# Patient Record
Sex: Female | Born: 1950 | Hispanic: Yes | Marital: Married | Smoking: Never smoker
Health system: Southern US, Community
[De-identification: ages and names within clinical notes are randomized; demographics above are authoritative.]

## PROBLEM LIST (undated history)

## (undated) DIAGNOSIS — C50919 Malignant neoplasm of unspecified site of unspecified female breast: Secondary | ICD-10-CM

## (undated) DIAGNOSIS — I10 Essential (primary) hypertension: Secondary | ICD-10-CM

## (undated) DIAGNOSIS — K219 Gastro-esophageal reflux disease without esophagitis: Secondary | ICD-10-CM

## (undated) HISTORY — PX: HYSTERECTOMY ABDOMINAL WITH SALPINGO-OOPHORECTOMY: SHX6792

## (undated) HISTORY — DX: Gastro-esophageal reflux disease without esophagitis: K21.9

## (undated) HISTORY — PX: MASTECTOMY: SHX3

## (undated) HISTORY — DX: Essential (primary) hypertension: I10

## (undated) HISTORY — DX: Malignant neoplasm of unspecified site of unspecified female breast: C50.919

---

## 2018-07-28 ENCOUNTER — Inpatient Hospital Stay (HOSPITAL_COMMUNITY)
Admission: AD | Admit: 2018-07-28 | Discharge: 2018-07-29 | DRG: 177 | Disposition: A | Discharge status: Home / Self Care | Payer: HRSA Program | Source: Other Acute Inpatient Hospital | Attending: Internal Medicine | Admitting: Internal Medicine

## 2018-07-28 ENCOUNTER — Inpatient Hospital Stay (HOSPITAL_COMMUNITY): Payer: HRSA Program

## 2018-07-28 DIAGNOSIS — J9601 Acute respiratory failure with hypoxia: Secondary | ICD-10-CM | POA: Diagnosis present

## 2018-07-28 DIAGNOSIS — D696 Thrombocytopenia, unspecified: Secondary | ICD-10-CM | POA: Diagnosis present

## 2018-07-28 DIAGNOSIS — Z8542 Personal history of malignant neoplasm of other parts of uterus: Secondary | ICD-10-CM | POA: Diagnosis not present

## 2018-07-28 DIAGNOSIS — Z79899 Other long term (current) drug therapy: Secondary | ICD-10-CM | POA: Diagnosis not present

## 2018-07-28 DIAGNOSIS — J1289 Other viral pneumonia: Secondary | ICD-10-CM | POA: Diagnosis present

## 2018-07-28 DIAGNOSIS — U071 COVID-19: Secondary | ICD-10-CM | POA: Diagnosis present

## 2018-07-28 DIAGNOSIS — I10 Essential (primary) hypertension: Secondary | ICD-10-CM | POA: Diagnosis present

## 2018-07-28 DIAGNOSIS — R0602 Shortness of breath: Secondary | ICD-10-CM

## 2018-07-28 DIAGNOSIS — R0902 Hypoxemia: Secondary | ICD-10-CM | POA: Diagnosis present

## 2018-07-28 LAB — COMPREHENSIVE METABOLIC PANEL
ALT: 28 U/L (ref 0–44)
AST: 30 U/L (ref 15–41)
Albumin: 3.1 g/dL — ABNORMAL LOW (ref 3.5–5.0)
Alkaline Phosphatase: 105 U/L (ref 38–126)
Anion gap: 9 (ref 5–15)
BUN: 13 mg/dL (ref 8–23)
CO2: 23 mmol/L (ref 22–32)
Calcium: 8.5 mg/dL — ABNORMAL LOW (ref 8.9–10.3)
Chloride: 106 mmol/L (ref 98–111)
Creatinine, Ser: 0.7 mg/dL (ref 0.44–1.00)
GFR calc Af Amer: >60 mL/min (ref >60)
GFR calc non Af Amer: >60 mL/min (ref >60)
Glucose, Bld: 95 mg/dL (ref 70–99)
Potassium: 4.1 mmol/L (ref 3.5–5.1)
Sodium: 138 mmol/L (ref 135–145)
Total Bilirubin: 1.1 mg/dL (ref 0.3–1.2)
Total Protein: 6.9 g/dL (ref 6.5–8.1)

## 2018-07-28 LAB — CBC WITH DIFFERENTIAL/PLATELET
Abs Immature Granulocytes: 0.05 10*3/uL (ref 0.00–0.07)
Basophils Absolute: 0 10*3/uL (ref 0.0–0.1)
Basophils Relative: 0 %
Eosinophils Absolute: 0 10*3/uL (ref 0.0–0.5)
Eosinophils Relative: 0 %
HCT: 43.8 % (ref 36.0–46.0)
Hemoglobin: 14.5 g/dL (ref 12.0–15.0)
Immature Granulocytes: 1 %
Lymphocytes Relative: 8 %
Lymphs Abs: 0.6 10*3/uL — ABNORMAL LOW (ref 0.7–4.0)
MCH: 30.2 pg (ref 26.0–34.0)
MCHC: 33.1 g/dL (ref 30.0–36.0)
MCV: 91.3 fL (ref 80.0–100.0)
Monocytes Absolute: 0.2 10*3/uL (ref 0.1–1.0)
Monocytes Relative: 3 %
Neutro Abs: 6 10*3/uL (ref 1.7–7.7)
Neutrophils Relative %: 88 %
Platelets: 101 10*3/uL — ABNORMAL LOW (ref 150–400)
RBC: 4.8 MIL/uL (ref 3.87–5.11)
RDW: 14 % (ref 11.5–15.5)
WBC: 6.8 10*3/uL (ref 4.0–10.5)
nRBC: 0 % (ref 0.0–0.2)

## 2018-07-28 LAB — C-REACTIVE PROTEIN: CRP: 25.3 mg/dL — ABNORMAL HIGH (ref <1.0)

## 2018-07-28 LAB — FERRITIN: Ferritin: 202 ng/mL (ref 11–307)

## 2018-07-28 LAB — STREP PNEUMONIAE URINARY ANTIGEN: Strep Pneumo Urinary Antigen: NEGATIVE

## 2018-07-28 LAB — D-DIMER, QUANTITATIVE: D-Dimer, Quant: 6.87 ug/mL-FEU — ABNORMAL HIGH (ref 0.00–0.50)

## 2018-07-28 LAB — ABO/RH: ABO/RH(D): A POS

## 2018-07-28 MED ORDER — ONDANSETRON HCL 4 MG/2ML IJ SOLN: 4.0000 mg | Freq: Four times a day (QID) | INTRAMUSCULAR | Status: DC | PRN

## 2018-07-28 MED ORDER — TOCILIZUMAB 400 MG/20ML IV SOLN
8.0000 mg/kg | Freq: Once | INTRAVENOUS | Status: AC
  Administered 2018-07-28: 508 mg via INTRAVENOUS
  Filled 2018-07-28: qty 10

## 2018-07-28 MED ORDER — ACETAMINOPHEN 325 MG PO TABS
650.0000 mg | ORAL_TABLET | Freq: Four times a day (QID) | ORAL | Status: DC | PRN
  Administered 2018-07-28: 650 mg via ORAL
  Filled 2018-07-28: qty 2

## 2018-07-28 MED ORDER — GUAIFENESIN-DM 100-10 MG/5ML PO SYRP
10.0000 mL | ORAL_SOLUTION | ORAL | Status: DC | PRN
  Administered 2018-07-29: 10 mL via ORAL
  Filled 2018-07-28: qty 10

## 2018-07-28 MED ORDER — METHYLPREDNISOLONE SODIUM SUCC 125 MG IJ SOLR
40.0000 mg | Freq: Two times a day (BID) | INTRAMUSCULAR | Status: DC
  Administered 2018-07-28 – 2018-07-29 (×2): 40 mg via INTRAVENOUS
  Filled 2018-07-28 (×2): qty 2

## 2018-07-28 MED ORDER — ONDANSETRON HCL 4 MG PO TABS: 4.0000 mg | ORAL_TABLET | Freq: Four times a day (QID) | ORAL | Status: DC | PRN

## 2018-07-28 MED ORDER — SODIUM CHLORIDE 0.9 % IV SOLN
500.0000 mg | INTRAVENOUS | Status: DC
  Administered 2018-07-29: 500 mg via INTRAVENOUS
  Filled 2018-07-28 (×2): qty 500

## 2018-07-28 MED ORDER — METOPROLOL TARTRATE 25 MG PO TABS
25.0000 mg | ORAL_TABLET | Freq: Every day | ORAL | Status: DC
  Administered 2018-07-28 – 2018-07-29 (×2): 25 mg via ORAL
  Filled 2018-07-28 (×2): qty 1

## 2018-07-28 MED ORDER — ENOXAPARIN SODIUM 40 MG/0.4ML ~~LOC~~ SOLN
40.0000 mg | SUBCUTANEOUS | Status: DC
  Administered 2018-07-28: 40 mg via SUBCUTANEOUS
  Filled 2018-07-28: qty 0.4

## 2018-07-28 MED ORDER — SODIUM CHLORIDE 0.9 % IV SOLN
1.0000 g | INTRAVENOUS | Status: DC
  Administered 2018-07-28: 23:00:00 1 g via INTRAVENOUS
  Filled 2018-07-28 (×3): qty 10

## 2018-07-28 MED ORDER — HYDROCOD POLST-CPM POLST ER 10-8 MG/5ML PO SUER
5.0000 mL | Freq: Two times a day (BID) | ORAL | Status: DC | PRN
  Administered 2018-07-28 – 2018-07-29 (×2): 5 mL via ORAL
  Filled 2018-07-28 (×2): qty 5

## 2018-07-28 NOTE — H&P (Addendum)
History and Physical    Beth Mueller QPY:[id number] DOB: 04-05-1950 DOA: 07/28/2018  PCP: Patient, No Pcp Per  Patient coming from: Home  I have personally briefly reviewed patient's old medical records in [place]  Chief Complaint: Fevers, cough, epigastric pain  HPI: Beth Mueller is a 68 y.o. female with medical history significant of hypertension, presents to the hospital with complaints fever, cough, epigastric pain.  Reports that her cough started approximately 3 days to 2 days.  She also complains of sore throat.  She has had no nausea or vomiting.  No diarrhea.  She complains of epigastric pain which is worse with cough and deep inspiration.  She was evaluated at [facility] where she was noted to be hypoxic with oxygen saturation of 79% on room air.  This improved to 93% on supplemental oxygen.  Chest x-ray was performed that showed bilateral infiltrates.  She is 89 febrile with temperature 101.  She is mildly tachycardic with a heart rate of 112.  She tested positive for COVID-19.  She has been referred for admission.  Patient does not speak english and history is obtained with the assistance of a video interpreter  Review of Systems:  General: Positive for fever, malaise, generalized weakness Respiratory: Positive for cough, pleuritic pain GI negative for nausea, problem, diarrhea All other systems reviewed and found to be negative.   Past medical history: Positive for hypertension, remote history of cancer and uterine cancer  Social History: No history of tobacco, alcohol or drug use  Not on File  Family history: Family history reviewed and not pertinent  Prior to Admission medications   Medication Sig Start Date End Date Taking? Authorizing Provider  acetaminophen (TYLENOL) 500 MG tablet Take 500 mg by mouth every 6 (six) hours as needed for mild pain, fever or headache.   Yes [provider]  metoprolol tartrate (LOPRESSOR) 25 MG  tablet Take 25 mg by mouth daily.   Yes [provider]    Physical Exam: Vitals:   07/28/18 1441 07/28/18 1500 07/28/18 1648  BP: 139/78 133/77   Resp: (!) 30 (!) 25 (!) 24  Temp: 98.9 F (37.2 C)    TempSrc: Axillary    SpO2: 95%    Weight:   63.4 kg  Height:   5' (1.524 m)    Constitutional: NAD, calm, comfortable Eyes: PERRL, lids and conjunctivae normal ENMT: Mucous membranes are moist. Pharyngeal erythema is present.Normal dentition.  Neck: normal, supple, no masses, no thyromegaly Respiratory: clear to auscultation bilaterally, no wheezing, no crackles. Normal respiratory effort. No accessory muscle use.  Cardiovascular: Regular rate and rhythm, no murmurs / rubs / gallops. No extremity edema. 2+ pedal pulses. No carotid bruits.  Abdomen: no tenderness, no masses palpated. No hepatosplenomegaly. Bowel sounds positive.  Musculoskeletal: no clubbing / cyanosis. No joint deformity upper and lower extremities. Good ROM, no contractures. Normal muscle tone.  Skin: no rashes, lesions, ulcers. No induration Neurologic: CN 2-12 grossly intact. Sensation intact, DTR normal. Strength 5/5 in all 4.  Psychiatric: Normal judgment and insight. Alert and oriented x 3. Normal mood.    Labs on Admission: I have personally reviewed following labs and imaging studies  CBC: Recent Labs  Lab 07/28/18 1545  WBC 6.8  NEUTROABS 6.0  HGB 14.5  HCT 43.8  MCV 91.3  PLT 124*   Basic Metabolic Panel: Recent Labs  Lab 07/28/18 1545  NA 138  K 4.1  CL 106  CO2 23  GLUCOSE  95  BUN 13  CREATININE 0.70  CALCIUM 8.5*   GFR: Estimated Creatinine Clearance: 58.3 mL/min (by C-G formula based on SCr of 0.7 mg/dL). Liver Function Tests: Recent Labs  Lab 07/28/18 1545  AST 30  ALT 28  ALKPHOS 105  BILITOT 1.1  PROT 6.9  ALBUMIN 3.1*   No results for input(s): LIPASE, AMYLASE in the last 168 hours. No results for input(s): AMMONIA in the last 168 hours. Coagulation  Profile: No results for input(s): INR, PROTIME in the last 168 hours. Cardiac Enzymes: No results for input(s): CKTOTAL, CKMB, CKMBINDEX, TROPONINI in the last 168 hours. BNP (last 3 results) No results for input(s): PROBNP in the last 8760 hours. HbA1C: No results for input(s): HGBA1C in the last 72 hours. CBG: No results for input(s): GLUCAP in the last 168 hours. Lipid Profile: No results for input(s): CHOL, HDL, LDLCALC, TRIG, CHOLHDL, LDLDIRECT in the last 72 hours. Thyroid Function Tests: No results for input(s): TSH, T4TOTAL, FREET4, T3FREE, THYROIDAB in the last 72 hours. Anemia Panel: Recent Labs    07/28/18 1545  FERRITIN 202   Urine analysis: No results found for: COLORURINE, APPEARANCEUR, LABSPEC, PHURINE, GLUCOSEU, HGBUR, BILIRUBINUR, KETONESUR, PROTEINUR, UROBILINOGEN, NITRITE, LEUKOCYTESUR  Radiological Exams on Admission: Portable Chest 1 View  Result Date: 07/28/2018 CLINICAL DATA:  Shortness of breath. EXAM: PORTABLE CHEST 1 VIEW COMPARISON:  None. FINDINGS: Stable cardiomediastinal silhouette. No pneumothorax is noted. Mild bibasilar subsegmental atelectasis or infiltrates are noted. No pleural effusion is noted. Bony thorax is unremarkable. IMPRESSION: Mild bibasilar subsegmental atelectasis or infiltrates are noted. Electronically Signed   By: Marijo Conception M.D.   On: 07/28/2018 16:00    Assessment/Plan Active Problems:   Acute respiratory failure with hypoxia (HCC)   HTN (hypertension)   COVID-19 virus infection   Thrombocytopenia ([place])     1. Acute respiratory failure with hypoxia, secondary to COVID-19/pneumonia.  She is requiring supplemental oxygen.  We will try to wean down to room air as tolerated.  Inflammatory markers negative.she will be started on a short course of steroids for 3 days as well as received a dose of Actemra.  Follow serial labs.  Insert urinary antigens.  Start azithromycin and ceftriaxone 2. Hypertension.  Blood pressure  elevated.  Continue metoprolol. 3. Thrombocytopenia.  Suspect this is related to acute infectious process.  Continue to monitor  DVT prophylaxis: lovenox Code Status: full code  Family Communication: no family present  Disposition Plan: discharge home once respiratory status has improved  Consults called:   Admission status: inpatient, progressive care   Kathie Dike MD [facility]   If 7PM-7AM, please contact night-coverage [email or web address]   07/28/2018, 6:55 PM

## 2018-07-29 ENCOUNTER — Encounter (HOSPITAL_COMMUNITY): Payer: Self-pay

## 2018-07-29 ENCOUNTER — Other Ambulatory Visit: Payer: Self-pay

## 2018-07-29 LAB — CBC WITH DIFFERENTIAL/PLATELET
Abs Immature Granulocytes: 0.03 10*3/uL (ref 0.00–0.07)
Basophils Absolute: 0 10*3/uL (ref 0.0–0.1)
Basophils Relative: 0 %
Eosinophils Absolute: 0 10*3/uL (ref 0.0–0.5)
Eosinophils Relative: 0 %
HCT: 39.6 % (ref 36.0–46.0)
Hemoglobin: 13 g/dL (ref 12.0–15.0)
Immature Granulocytes: 1 %
Lymphocytes Relative: 7 %
Lymphs Abs: 0.4 10*3/uL — ABNORMAL LOW (ref 0.7–4.0)
MCH: 30 pg (ref 26.0–34.0)
MCHC: 32.8 g/dL (ref 30.0–36.0)
MCV: 91.2 fL (ref 80.0–100.0)
Monocytes Absolute: 0.1 10*3/uL (ref 0.1–1.0)
Monocytes Relative: 1 %
Neutro Abs: 4.5 10*3/uL (ref 1.7–7.7)
Neutrophils Relative %: 91 %
Platelets: 108 10*3/uL — ABNORMAL LOW (ref 150–400)
RBC: 4.34 MIL/uL (ref 3.87–5.11)
RDW: 14 % (ref 11.5–15.5)
WBC: 5 10*3/uL (ref 4.0–10.5)
nRBC: 0 % (ref 0.0–0.2)

## 2018-07-29 LAB — BLOOD CULTURE ID PANEL (REFLEXED)

## 2018-07-29 LAB — COMPREHENSIVE METABOLIC PANEL
ALT: 23 U/L (ref 0–44)
AST: 28 U/L (ref 15–41)
Albumin: 2.8 g/dL — ABNORMAL LOW (ref 3.5–5.0)
Alkaline Phosphatase: 91 U/L (ref 38–126)
Anion gap: 8 (ref 5–15)
BUN: 15 mg/dL (ref 8–23)
CO2: 21 mmol/L — ABNORMAL LOW (ref 22–32)
Calcium: 8.1 mg/dL — ABNORMAL LOW (ref 8.9–10.3)
Chloride: 106 mmol/L (ref 98–111)
Creatinine, Ser: 0.6 mg/dL (ref 0.44–1.00)
GFR calc Af Amer: >60 mL/min (ref >60)
GFR calc non Af Amer: >60 mL/min (ref >60)
Glucose, Bld: 155 mg/dL — ABNORMAL HIGH (ref 70–99)
Potassium: 4.2 mmol/L (ref 3.5–5.1)
Sodium: 135 mmol/L (ref 135–145)
Total Bilirubin: 0.7 mg/dL (ref 0.3–1.2)
Total Protein: 6.4 g/dL — ABNORMAL LOW (ref 6.5–8.1)

## 2018-07-29 LAB — FERRITIN: Ferritin: 205 ng/mL (ref 11–307)

## 2018-07-29 LAB — C-REACTIVE PROTEIN: CRP: 22.8 mg/dL — ABNORMAL HIGH (ref <1.0)

## 2018-07-29 LAB — HIV ANTIBODY (ROUTINE TESTING W REFLEX): HIV Screen 4th Generation wRfx: NONREACTIVE

## 2018-07-29 LAB — D-DIMER, QUANTITATIVE: D-Dimer, Quant: 6.08 ug/mL-FEU — ABNORMAL HIGH (ref 0.00–0.50)

## 2018-07-29 MED ORDER — HYDROCOD POLST-CPM POLST ER 10-8 MG/5ML PO SUER: 5.0000 mL | Freq: Two times a day (BID) | ORAL | 0 refills | Status: AC | PRN

## 2018-07-29 MED ORDER — GUAIFENESIN ER 600 MG PO TB12: 600.0000 mg | ORAL_TABLET | Freq: Two times a day (BID) | ORAL | 2 refills | Status: AC

## 2018-07-29 MED ORDER — ENSURE ENLIVE PO LIQD
237.0000 mL | Freq: Two times a day (BID) | ORAL | Status: DC
  Administered 2018-07-29: 11:00:00 237 mL via ORAL

## 2018-07-29 MED ORDER — PREDNISONE 10 MG PO TABS: ORAL_TABLET | ORAL | 0 refills | Status: DC

## 2018-07-29 MED ORDER — ASPIRIN EC 325 MG PO TBEC: 325.0000 mg | DELAYED_RELEASE_TABLET | Freq: Two times a day (BID) | ORAL | 0 refills | Status: DC

## 2018-07-29 MED ORDER — AZITHROMYCIN 500 MG PO TABS: 500.0000 mg | ORAL_TABLET | Freq: Every day | ORAL | 0 refills | Status: DC

## 2018-07-29 MED ORDER — PANTOPRAZOLE SODIUM 40 MG PO TBEC: 40.0000 mg | DELAYED_RELEASE_TABLET | Freq: Every day | ORAL | 1 refills | Status: DC

## 2018-07-29 NOTE — Progress Notes (Signed)
Initial Nutrition Assessment   RD working remotely.  DOCUMENTATION CODES:   Not applicable  INTERVENTION:   Liberalize diet to REGULAR with promote po intake; messaged Attending MD with recommendation  Ensure Enlive po BID, each supplement provides 350 kcal and 20 grams of protein  NUTRITION DIAGNOSIS:   Increased nutrient needs related to acute illness as evidenced by estimated needs.  GOAL:   Patient will meet greater than or equal to 90% of their needs  MONITOR:   PO intake, Supplement acceptance, Labs, Weight trends  REASON FOR ASSESSMENT:   Consult Assessment of nutrition requirement/status  ASSESSMENT:   68 yo female admitted with acute respiratory failure with COVID-19 pneumonia. PMH includes HTN, hx of uterine cancer  No recorded po intake; unable to assess diet and weight history.  No previous weight encounters.   No N/V/D  Labs: reviewed Meds: solumedrol  NUTRITION - FOCUSED PHYSICAL EXAM:  Unable to assess, working remotely  Diet Order:   Diet Order            Diet Heart Room service appropriate? Yes; Fluid consistency: Thin  Diet effective now              EDUCATION NEEDS:   Not appropriate for education at this time  Skin:  Skin Assessment: Reviewed RN Assessment  Last BM:  5/14  Height:   Ht Readings from Last 1 Encounters:  07/28/18 5' (1.524 m)    Weight:   Wt Readings from Last 1 Encounters:  07/28/18 63.4 kg    Ideal Body Weight:  45.5 kg  BMI:  Body mass index is 27.28 kg/m.  Estimated Nutritional Needs:   Kcal:  1600-1800 kcals   Protein:  80-90 g   Fluid:  >/= 1.6 L   Kerman Passey MS, RD, LDN, CNSC [phone] Pager  [phone] Weekend/On-Call Pager

## 2018-07-29 NOTE — Progress Notes (Signed)
Discharge instructions reviewed with patient with the translator. Answered all questions and concerns. Patiens IV discontinued. Transported patient to family member down stairs.

## 2018-07-29 NOTE — Plan of Care (Signed)
  Problem: Coping: Goal: Psychosocial and spiritual needs will be supported Outcome: Progressing   Problem: Respiratory: Goal: Will maintain a patent airway Outcome: Progressing   

## 2018-07-29 NOTE — Progress Notes (Signed)
PHARMACY - PHYSICIAN COMMUNICATION CRITICAL VALUE ALERT - BLOOD CULTURE IDENTIFICATION (BCID)  Beth Mueller is an 68 y.o. female who presented to [facility] on 07/28/2018 with a chief complaint of fever, cough and epigastric pain  Assessment:  Diagnosed with COVID-19. Now 1/2 BCx showing gram positive cocci on gram stain in 1/2 blood cultures. This is likely a contaminant. WBC wnl  Name of physician (or Provider) Contacted: Dr. Roderic Palau  Current antibiotics: Ceftriaxone and Azithromycin for pneumonia   Changes to prescribed antibiotics recommended:  Patient is on recommended antibiotics - No changes needed  Results for orders placed or performed during the hospital encounter of 07/28/18  Blood Culture ID Panel (Reflexed) (Collected: 07/28/2018  3:17 PM)  Result Value Ref Range   Enterococcus species NOT DETECTED NOT DETECTED   Listeria monocytogenes NOT DETECTED NOT DETECTED   Staphylococcus species DETECTED (A) NOT DETECTED   Staphylococcus aureus (BCID) NOT DETECTED NOT DETECTED   Methicillin resistance NOT DETECTED NOT DETECTED   Streptococcus species NOT DETECTED NOT DETECTED   Streptococcus agalactiae NOT DETECTED NOT DETECTED   Streptococcus pneumoniae NOT DETECTED NOT DETECTED   Streptococcus pyogenes NOT DETECTED NOT DETECTED   Acinetobacter baumannii NOT DETECTED NOT DETECTED   Enterobacteriaceae species NOT DETECTED NOT DETECTED   Enterobacter cloacae complex NOT DETECTED NOT DETECTED   Escherichia coli NOT DETECTED NOT DETECTED   Klebsiella oxytoca NOT DETECTED NOT DETECTED   Klebsiella pneumoniae NOT DETECTED NOT DETECTED   Proteus species NOT DETECTED NOT DETECTED   Serratia marcescens NOT DETECTED NOT DETECTED   Haemophilus influenzae NOT DETECTED NOT DETECTED   Neisseria meningitidis NOT DETECTED NOT DETECTED   Pseudomonas aeruginosa NOT DETECTED NOT DETECTED   Candida albicans NOT DETECTED NOT DETECTED   Candida glabrata NOT DETECTED NOT DETECTED   Candida  krusei NOT DETECTED NOT DETECTED   Candida parapsilosis NOT DETECTED NOT DETECTED   Candida tropicalis NOT DETECTED NOT DETECTED    Albertina Parr, PharmD., BCPS Clinical Pharmacist Clinical phone for 07/29/18 until 5pm: [phone]

## 2018-07-29 NOTE — Discharge Summary (Signed)
Physician Discharge Summary  Beth Mueller ACZ:[id number] DOB: 1950/07/26 DOA: 07/28/2018  PCP: Patient, No Pcp Per  Admit date: 07/28/2018 Discharge date: 07/29/2018  Admitted From: Home Disposition: Home  Recommendations for Outpatient Follow-up:  1. Follow up with PCP in 1-2 weeks 2. Please obtain BMP/CBC in one week 3. Patient received instructions on how to isolate at home.  She was advised on warning symptoms for her to return to the emergency room.  Discharge Condition: Stable CODE STATUS: Full code Diet recommendation: Heart healthy  Brief/Interim Summary: 68 year old female presented to [facility] with complaints of fevers, cough and epigastric pain with cough.  She was found to have coronavirus.  She was found to be hypoxic on room air requiring 3 L of oxygen.  Her husband is currently admitted to the hospital with a coronavirus infection.  She was transferred to [facility] for further management.  She did receive a dose of Actemra and steroids.  By the following day, she was on room air and ambulating without difficulty.  She did have a minimal cough, but this improved with anti tussives. She has been ambulating without difficulty. She has been transitioned to a steroid taper. She feels comfortable going home and isolating at home.   Discharge Diagnoses:  Active Problems:   Acute respiratory failure with hypoxia (HCC)   HTN (hypertension)   COVID-19 virus infection   Thrombocytopenia (HCC)    Discharge Instructions  Discharge Instructions    Diet - low sodium heart healthy   Complete by:  As directed    Increase activity slowly   Complete by:  As directed      Allergies as of 07/29/2018   Not on File     Medication List    TAKE these medications   acetaminophen 500 MG tablet Commonly known as:  TYLENOL Take 500 mg by mouth every 6 (six) hours as needed for mild pain, fever or headache.   aspirin EC 325 MG tablet Take 1 tablet (325 mg  total) by mouth 2 (two) times a day for 10 days.   azithromycin 500 MG tablet Commonly known as:  Zithromax Take 1 tablet (500 mg total) by mouth daily for 4 days. Take 1 tablet daily for 3 days.   chlorpheniramine-HYDROcodone 10-8 MG/5ML Suer Commonly known as:  TUSSIONEX Take 5 mLs by mouth every 12 (twelve) hours as needed for cough (refractory).   guaiFENesin 600 MG 12 hr tablet Commonly known as:  Mucinex Take 1 tablet (600 mg total) by mouth 2 (two) times daily.   metoprolol tartrate 25 MG tablet Commonly known as:  LOPRESSOR Take 25 mg by mouth daily.   pantoprazole 40 MG tablet Commonly known as:  Protonix Take 1 tablet (40 mg total) by mouth daily.   predniSONE 10 MG tablet Commonly known as:  DELTASONE Take 40mg  po daily for 2 days then 30mg  daily for 2 days then 20mg  daily for 2 days then 10mg  daily for 2 days then stop      Follow-up [facility] Follow up on 08/02/2018.   Why:  at 2:30pm for your televisit hospital follow-up appointment; please be available for the call.  Contact information: [address] [place]  [address] [phone]         Not on File  Consultations:     Procedures/Studies: Portable Chest 1 View  Result Date: 07/28/2018 CLINICAL DATA:  Shortness of breath. EXAM: PORTABLE CHEST 1 VIEW  COMPARISON:  None. FINDINGS: Stable cardiomediastinal silhouette. No pneumothorax is noted. Mild bibasilar subsegmental atelectasis or infiltrates are noted. No pleural effusion is noted. Bony thorax is unremarkable. IMPRESSION: Mild bibasilar subsegmental atelectasis or infiltrates are noted. Electronically Signed   By: Marijo Conception M.D.   On: 07/28/2018 16:00       Subjective: Feeling better today.  Shortness of breath is better.  Able to ambulate.  Still has some cough, but this improves with cough medicine. Discharge Exam: Vitals:   07/29/18 0818 07/29/18 0858 07/29/18  1216 07/29/18 1702  BP:  116/78    Pulse:  72    Resp:      Temp: 98.2 F (36.8 C)  98.2 F (36.8 C) 98.2 F (36.8 C)  TempSrc: Oral  Oral Oral  SpO2:      Weight:      Height:        General: Pt is alert, awake, not in acute distress Cardiovascular: RRR, S1/S2 +, no rubs, no gallops Respiratory: CTA bilaterally, no wheezing, no rhonchi Abdominal: Soft, NT, ND, bowel sounds + Extremities: no edema, no cyanosis    The results of significant diagnostics from this hospitalization (including imaging, microbiology, ancillary and laboratory) are listed below for reference.     Microbiology: Recent Results (from the past 240 hour(s))  Culture, blood (Routine X 2) w Reflex to ID Panel     Status: None (Preliminary result)   Collection Time: 07/28/18  3:14 PM  Result Value Ref Range Status   Specimen Description BLOOD LEFT ANTECUBITAL  Final   Special Requests   Final    BOTTLES DRAWN AEROBIC ONLY Blood Culture adequate volume   Culture   Final    NO GROWTH < 24 HOURS Performed at [facility], [address], [place], [place] [address]    Report Status PENDING  Incomplete  Culture, blood (Routine X 2) w Reflex to ID Panel     Status: None (Preliminary result)   Collection Time: 07/28/18  3:17 PM  Result Value Ref Range Status   Specimen Description BLOOD LEFT HAND  Final   Special Requests   Final    BOTTLES DRAWN AEROBIC ONLY Blood Culture adequate volume   Culture  Setup Time   Final    GRAM POSITIVE COCCI AEROBIC BOTTLE ONLY Organism ID to follow CRITICAL RESULT CALLED TO, READ BACK BY AND VERIFIED WITH: [place] [id number] MLM Performed at [facility], [place] [address], [place], [place] [address]    Culture GRAM POSITIVE COCCI  Final   Report Status PENDING  Incomplete  Blood Culture ID Panel (Reflexed)     Status: Abnormal   Collection Time: 07/28/18  3:17 PM  Result Value Ref Range Status   Enterococcus species NOT DETECTED NOT DETECTED  Final   Listeria monocytogenes NOT DETECTED NOT DETECTED Final   Staphylococcus species DETECTED (A) NOT DETECTED Final    Comment: Methicillin (oxacillin) susceptible coagulase negative staphylococcus. Possible blood culture contaminant (unless isolated from more than one blood culture draw or clinical case suggests pathogenicity). No antibiotic treatment is indicated for blood  culture contaminants. CRITICAL RESULT CALLED TO, READ BACK BY AND VERIFIED WITH: PHARMD B MANCHERIL [id number] MLM    Staphylococcus aureus (BCID) NOT DETECTED NOT DETECTED Final   Methicillin resistance NOT DETECTED NOT DETECTED Final   Streptococcus species NOT DETECTED NOT DETECTED Final   Streptococcus agalactiae NOT DETECTED NOT DETECTED Final   Streptococcus pneumoniae NOT  DETECTED NOT DETECTED Final   Streptococcus pyogenes NOT DETECTED NOT DETECTED Final   Acinetobacter baumannii NOT DETECTED NOT DETECTED Final   Enterobacteriaceae species NOT DETECTED NOT DETECTED Final   Enterobacter cloacae complex NOT DETECTED NOT DETECTED Final   Escherichia coli NOT DETECTED NOT DETECTED Final   Klebsiella oxytoca NOT DETECTED NOT DETECTED Final   Klebsiella pneumoniae NOT DETECTED NOT DETECTED Final   Proteus species NOT DETECTED NOT DETECTED Final   Serratia marcescens NOT DETECTED NOT DETECTED Final   Haemophilus influenzae NOT DETECTED NOT DETECTED Final   Neisseria meningitidis NOT DETECTED NOT DETECTED Final   Pseudomonas aeruginosa NOT DETECTED NOT DETECTED Final   Candida albicans NOT DETECTED NOT DETECTED Final   Candida glabrata NOT DETECTED NOT DETECTED Final   Candida krusei NOT DETECTED NOT DETECTED Final   Candida parapsilosis NOT DETECTED NOT DETECTED Final   Candida tropicalis NOT DETECTED NOT DETECTED Final    Comment: Performed at [facility], [place] [address] [place], [place] [address]     Labs: BNP (last 3 results) No results for input(s): BNP in the last 8760 hours. Basic  Metabolic Panel: Recent Labs  Lab 07/28/18 1545 07/29/18 0332  NA 138 135  K 4.1 4.2  CL 106 106  CO2 23 21*  GLUCOSE 95 155*  BUN 13 15  CREATININE 0.70 0.60  CALCIUM 8.5* 8.1*   Liver Function Tests: Recent Labs  Lab 07/28/18 1545 07/29/18 0332  AST 30 28  ALT 28 23  ALKPHOS 105 91  BILITOT 1.1 0.7  PROT 6.9 6.4*  ALBUMIN 3.1* 2.8*   No results for input(s): LIPASE, AMYLASE in the last 168 hours. No results for input(s): AMMONIA in the last 168 hours. CBC: Recent Labs  Lab 07/28/18 1545 07/29/18 0332  WBC 6.8 5.0  NEUTROABS 6.0 4.5  HGB 14.5 13.0  HCT 43.8 39.6  MCV 91.3 91.2  PLT 101* 108*   Cardiac Enzymes: No results for input(s): CKTOTAL, CKMB, CKMBINDEX, TROPONINI in the last 168 hours. BNP: Invalid input(s): POCBNP CBG: No results for input(s): GLUCAP in the last 168 hours. D-Dimer Recent Labs    07/28/18 1545 07/29/18 0332  DDIMER 6.87* 6.08*   Hgb A1c No results for input(s): HGBA1C in the last 72 hours. Lipid Profile No results for input(s): CHOL, HDL, LDLCALC, TRIG, CHOLHDL, LDLDIRECT in the last 72 hours. Thyroid function studies No results for input(s): TSH, T4TOTAL, T3FREE, THYROIDAB in the last 72 hours.  Invalid input(s): FREET3 Anemia work up Recent Labs    07/28/18 1545 07/29/18 0332  FERRITIN 202 205   Urinalysis No results found for: COLORURINE, APPEARANCEUR, [place], [place], GLUCOSEU, [place], [place], [place], PROTEINUR, UROBILINOGEN, NITRITE, LEUKOCYTESUR Sepsis Labs Invalid input(s): PROCALCITONIN,  WBC,  LACTICIDVEN Microbiology Recent Results (from the past 240 hour(s))  Culture, blood (Routine X 2) w Reflex to ID Panel     Status: None (Preliminary result)   Collection Time: 07/28/18  3:14 PM  Result Value Ref Range Status   Specimen Description BLOOD LEFT ANTECUBITAL  Final   Special Requests   Final    BOTTLES DRAWN AEROBIC ONLY Blood Culture adequate volume   Culture   Final    NO GROWTH < 24  HOURS Performed at [facility], [address] [place], [place] [address]    Report Status PENDING  Incomplete  Culture, blood (Routine X 2) w Reflex to ID Panel     Status: None (Preliminary result)   Collection Time: 07/28/18  3:17 PM  Result Value Ref Range Status   Specimen Description BLOOD LEFT HAND  Final   Special Requests   Final    BOTTLES DRAWN AEROBIC ONLY Blood Culture adequate volume   Culture  Setup Time   Final    GRAM POSITIVE COCCI AEROBIC BOTTLE ONLY Organism ID to follow CRITICAL RESULT CALLED TO, READ BACK BY AND VERIFIED WITH: [place] [id number] MLM Performed at [facility], [place] [address] [place], [place] [address]    Culture GRAM POSITIVE COCCI  Final   Report Status PENDING  Incomplete  Blood Culture ID Panel (Reflexed)     Status: Abnormal   Collection Time: 07/28/18  3:17 PM  Result Value Ref Range Status   Enterococcus species NOT DETECTED NOT DETECTED Final   Listeria monocytogenes NOT DETECTED NOT DETECTED Final   Staphylococcus species DETECTED (A) NOT DETECTED Final    Comment: Methicillin (oxacillin) susceptible coagulase negative staphylococcus. Possible blood culture contaminant (unless isolated from more than one blood culture draw or clinical case suggests pathogenicity). No antibiotic treatment is indicated for blood  culture contaminants. CRITICAL RESULT CALLED TO, READ BACK BY AND VERIFIED WITH: PHARMD B MANCHERIL [id number] MLM    Staphylococcus aureus (BCID) NOT DETECTED NOT DETECTED Final   Methicillin resistance NOT DETECTED NOT DETECTED Final   Streptococcus species NOT DETECTED NOT DETECTED Final   Streptococcus agalactiae NOT DETECTED NOT DETECTED Final   Streptococcus pneumoniae NOT DETECTED NOT DETECTED Final   Streptococcus pyogenes NOT DETECTED NOT DETECTED Final   Acinetobacter baumannii NOT DETECTED NOT DETECTED Final   Enterobacteriaceae species NOT DETECTED NOT DETECTED Final   Enterobacter  cloacae complex NOT DETECTED NOT DETECTED Final   Escherichia coli NOT DETECTED NOT DETECTED Final   Klebsiella oxytoca NOT DETECTED NOT DETECTED Final   Klebsiella pneumoniae NOT DETECTED NOT DETECTED Final   Proteus species NOT DETECTED NOT DETECTED Final   Serratia marcescens NOT DETECTED NOT DETECTED Final   Haemophilus influenzae NOT DETECTED NOT DETECTED Final   Neisseria meningitidis NOT DETECTED NOT DETECTED Final   Pseudomonas aeruginosa NOT DETECTED NOT DETECTED Final   Candida albicans NOT DETECTED NOT DETECTED Final   Candida glabrata NOT DETECTED NOT DETECTED Final   Candida krusei NOT DETECTED NOT DETECTED Final   Candida parapsilosis NOT DETECTED NOT DETECTED Final   Candida tropicalis NOT DETECTED NOT DETECTED Final    Comment: Performed at [facility], [place]. [address], [place], [place] [address]     Time coordinating discharge: 43mins  SIGNED:   Kathie Dike, MD  [facility] 07/29/2018, 7:44 PM   If 7PM-7AM, please contact night-coverage [email or web address]

## 2018-07-29 NOTE — TOC Transition Note (Signed)
Transition of Care [facility]) - CM/SW Discharge Note   Patient Details  Name: Beth Mueller MRN: [id number] Date of Birth: April 14, 1950  Transition of Care [facility] CM/SW Contact:  Midge Minium RN, BSN, NCM-BC, ACM-RN [phone] Phone Number: 07/29/2018, 4:00 PM   Clinical Narrative:    CM consult acknowledged for CM assist for DME. CM spoke to the patient utilizing [employer] [id number]). Patient stated she lives at home with her daughter [name]; has (4) children. Patient confirmed as having no active health insurance, nor an established PCP. CM assessed medication assistance needs, with the patient stating she has (2) daughters who can assist her with her Rx sent to [facility] after discharge. CM arrange a televisit hospital follow-up appointment with: [facility] on 08/02/18 @ 1430; AVS updated. Patient indicated that her family would be available to provide transportation home.    Final next level of care: Home/Self Care Barriers to Discharge: No Barriers Identified   Patient Goals and CMS Choice Patient states their goals for this hospitalization and ongoing recovery are:: "to return home with my daughter [name]"   Choice offered to / list presented to : NA  Discharge Plan and Services           DME Arranged: N/A DME Agency: NA       HH Arranged: NA HH Agency: NA        Social Determinants of Health (SDOH) Interventions     Readmission Risk Interventions No flowsheet data found.

## 2018-07-29 NOTE — Progress Notes (Signed)
Called patients family and gave them an update

## 2018-07-29 NOTE — Progress Notes (Signed)
CardioVascular Research Department and AHF Team  ReDS Research Project   Patient #: [id number]  ReDS Measurement  Right: 27 %  Left: 31 %

## 2018-07-29 NOTE — Discharge Instructions (Signed)
Person Under Monitoring Name: Beth Mueller  Location: [place] [address]   Infection Prevention Recommendations for Individuals Confirmed to have, or Being Evaluated for, 2019 Novel Coronavirus (COVID-19) Infection Who Receive Care at Home  Individuals who are confirmed to have, or are being evaluated for, COVID-19 should follow the prevention steps below until a healthcare provider or local or state health department says they can return to normal activities.  Stay home except to get medical care You should restrict activities outside your home, except for getting medical care. Do not go to work, school, or public areas, and do not use public transportation or taxis.  Call ahead before visiting your doctor Before your medical appointment, call the healthcare provider and tell them that you have, or are being evaluated for, COVID-19 infection. This will help the healthcare providers office take steps to keep other people from getting infected. Ask your healthcare provider to call the local or state health department.  Monitor your symptoms Seek prompt medical attention if your illness is worsening (e.g., difficulty breathing). Before going to your medical appointment, call the healthcare provider and tell them that you have, or are being evaluated for, COVID-19 infection. Ask your healthcare provider to call the local or state health department.  Wear a facemask You should wear a facemask that covers your nose and mouth when you are in the same room with other people and when you visit a healthcare provider. People who live with or visit you should also wear a facemask while they are in the same room with you.  Separate yourself from other people in your home As much as possible, you should stay in a different room from other people in your home. Also, you should use a separate bathroom, if available.  Avoid sharing household items You should not  share dishes, drinking glasses, cups, eating utensils, towels, bedding, or other items with other people in your home. After using these items, you should wash them thoroughly with soap and water.  Cover your coughs and sneezes Cover your mouth and nose with a tissue when you cough or sneeze, or you can cough or sneeze into your sleeve. Throw used tissues in a lined trash can, and immediately wash your hands with soap and water for at least 20 seconds or use an alcohol-based hand rub.  Wash your [facility] your hands often and thoroughly with soap and water for at least 20 seconds. You can use an alcohol-based hand sanitizer if soap and water are not available and if your hands are not visibly dirty. Avoid touching your eyes, nose, and mouth with unwashed hands.   Prevention Steps for Caregivers and Household Members of Individuals Confirmed to have, or Being Evaluated for, COVID-19 Infection Being Cared for in the Home  If you live with, or provide care at home for, a person confirmed to have, or being evaluated for, COVID-19 infection please follow these guidelines to prevent infection:  Follow healthcare providers instructions Make sure that you understand and can help the patient follow any healthcare provider instructions for all care.  Provide for the patients basic needs You should help the patient with basic needs in the home and provide support for getting groceries, prescriptions, and other personal needs.  Monitor the patients symptoms If they are getting sicker, call his or her medical provider and tell them that the patient has, or is being evaluated for, COVID-19 infection. This will help the healthcare providers  office take steps to keep other people from getting infected. Ask the healthcare provider to call the local or state health department.  Limit the number of people who have contact with the patient  If possible, have only one caregiver for the  patient.  Other household members should stay in another home or place of residence. If this is not possible, they should stay  in another room, or be separated from the patient as much as possible. Use a separate bathroom, if available.  Restrict visitors who do not have an essential need to be in the home.  Keep older adults, very young children, and other sick people away from the patient Keep older adults, very young children, and those who have compromised immune systems or chronic health conditions away from the patient. This includes people with chronic heart, lung, or kidney conditions, diabetes, and cancer.  Ensure good ventilation Make sure that shared spaces in the home have good air flow, such as from an air conditioner or an opened window, weather permitting.  Wash your hands often  Wash your hands often and thoroughly with soap and water for at least 20 seconds. You can use an alcohol based hand sanitizer if soap and water are not available and if your hands are not visibly dirty.  Avoid touching your eyes, nose, and mouth with unwashed hands.  Use disposable paper towels to dry your hands. If not available, use dedicated cloth towels and replace them when they become wet.  Wear a facemask and gloves  Wear a disposable facemask at all times in the room and gloves when you touch or have contact with the patients blood, body fluids, and/or secretions or excretions, such as sweat, saliva, sputum, nasal mucus, vomit, urine, or feces.  Ensure the mask fits over your nose and mouth tightly, and do not touch it during use.  Throw out disposable facemasks and gloves after using them. Do not reuse.  Wash your hands immediately after removing your facemask and gloves.  If your personal clothing becomes contaminated, carefully remove clothing and launder. Wash your hands after handling contaminated clothing.  Place all used disposable facemasks, gloves, and other waste in a lined  container before disposing them with other household waste.  Remove gloves and wash your hands immediately after handling these items.  Do not share dishes, glasses, or other household items with the patient  Avoid sharing household items. You should not share dishes, drinking glasses, cups, eating utensils, towels, bedding, or other items with a patient who is confirmed to have, or being evaluated for, COVID-19 infection.  After the person uses these items, you should wash them thoroughly with soap and water.  Wash laundry thoroughly  Immediately remove and wash clothes or bedding that have blood, body fluids, and/or secretions or excretions, such as sweat, saliva, sputum, nasal mucus, vomit, urine, or feces, on them.  Wear gloves when handling laundry from the patient.  Read and follow directions on labels of laundry or clothing items and detergent. In general, wash and dry with the warmest temperatures recommended on the label.  Clean all areas the individual has used often  Clean all touchable surfaces, such as counters, tabletops, doorknobs, bathroom fixtures, toilets, phones, keyboards, tablets, and bedside tables, every day. Also, clean any surfaces that may have blood, body fluids, and/or secretions or excretions on them.  Wear gloves when cleaning surfaces the patient has come in contact with.  Use a diluted bleach solution (e.g., dilute bleach with 1  part bleach and 10 parts water) or a household disinfectant with a label that says EPA-registered for coronaviruses. To make a bleach solution at home, add 1 tablespoon of bleach to 1 quart (4 cups) of water. For a larger supply, add  cup of bleach to 1 gallon (16 cups) of water.  Read labels of cleaning products and follow recommendations provided on product labels. Labels contain instructions for safe and effective use of the cleaning product including precautions you should take when applying the product, such as wearing gloves or  eye protection and making sure you have good ventilation during use of the product.  Remove gloves and wash hands immediately after cleaning.  Monitor yourself for signs and symptoms of illness Caregivers and household members are considered close contacts, should monitor their health, and will be asked to limit movement outside of the home to the extent possible. Follow the monitoring steps for close contacts listed on the symptom monitoring form.   ? If you have additional questions, contact your local health department or call the epidemiologist on call at [phone] (available 24/7). ? This guidance is subject to change. For the most up-to-date guidance from [facility], please refer to their website: [email or web address]

## 2018-07-31 LAB — CULTURE, BLOOD (ROUTINE X 2)
Special Requests: ADEQUATE
Special Requests: ADEQUATE

## 2018-08-01 LAB — LEGIONELLA PNEUMOPHILA SEROGP 1 UR AG: L. pneumophila Serogp 1 Ur Ag: NEGATIVE

## 2018-08-02 ENCOUNTER — Inpatient Hospital Stay (HOSPITAL_COMMUNITY)
Admission: AD | Admit: 2018-08-02 | Discharge: 2018-08-06 | DRG: 177 | Disposition: A | Discharge status: Home / Self Care | Payer: HRSA Program | Source: Other Acute Inpatient Hospital | Attending: Internal Medicine | Admitting: Internal Medicine

## 2018-08-02 ENCOUNTER — Ambulatory Visit: Payer: Self-pay | Admitting: Primary Care

## 2018-08-02 ENCOUNTER — Encounter (HOSPITAL_COMMUNITY): Payer: Self-pay

## 2018-08-02 DIAGNOSIS — J1289 Other viral pneumonia: Secondary | ICD-10-CM | POA: Diagnosis present

## 2018-08-02 DIAGNOSIS — J9601 Acute respiratory failure with hypoxia: Secondary | ICD-10-CM | POA: Diagnosis present

## 2018-08-02 DIAGNOSIS — Z7982 Long term (current) use of aspirin: Secondary | ICD-10-CM | POA: Diagnosis not present

## 2018-08-02 DIAGNOSIS — U071 COVID-19: Secondary | ICD-10-CM | POA: Diagnosis present

## 2018-08-02 DIAGNOSIS — G9340 Encephalopathy, unspecified: Secondary | ICD-10-CM | POA: Diagnosis present

## 2018-08-02 DIAGNOSIS — K5900 Constipation, unspecified: Secondary | ICD-10-CM | POA: Diagnosis present

## 2018-08-02 DIAGNOSIS — I10 Essential (primary) hypertension: Secondary | ICD-10-CM | POA: Diagnosis present

## 2018-08-02 DIAGNOSIS — R109 Unspecified abdominal pain: Secondary | ICD-10-CM

## 2018-08-02 DIAGNOSIS — R1032 Left lower quadrant pain: Secondary | ICD-10-CM | POA: Diagnosis not present

## 2018-08-02 DIAGNOSIS — R0602 Shortness of breath: Secondary | ICD-10-CM | POA: Diagnosis present

## 2018-08-02 MED ORDER — SODIUM CHLORIDE 0.9 % IV SOLN
250.0000 mL | INTRAVENOUS | Status: DC | PRN
  Administered 2018-08-03: 09:00:00 250 mL via INTRAVENOUS

## 2018-08-02 MED ORDER — SODIUM CHLORIDE 0.9% FLUSH: 3.0000 mL | INTRAVENOUS | Status: DC | PRN

## 2018-08-02 MED ORDER — ENOXAPARIN SODIUM 40 MG/0.4ML ~~LOC~~ SOLN
40.0000 mg | SUBCUTANEOUS | Status: DC
  Administered 2018-08-02: 23:00:00 40 mg via SUBCUTANEOUS
  Filled 2018-08-02: qty 0.4

## 2018-08-02 MED ORDER — ZINC SULFATE 220 (50 ZN) MG PO CAPS
220.0000 mg | ORAL_CAPSULE | Freq: Every day | ORAL | Status: DC
  Administered 2018-08-02 – 2018-08-06 (×5): 220 mg via ORAL
  Filled 2018-08-02 (×5): qty 1

## 2018-08-02 MED ORDER — TOCILIZUMAB 400 MG/20ML IV SOLN: 8.0000 mg/kg | Freq: Once | INTRAVENOUS | Status: DC

## 2018-08-02 MED ORDER — ONDANSETRON HCL 4 MG PO TABS: 4.0000 mg | ORAL_TABLET | Freq: Four times a day (QID) | ORAL | Status: DC | PRN

## 2018-08-02 MED ORDER — GUAIFENESIN-DM 100-10 MG/5ML PO SYRP
10.0000 mL | ORAL_SOLUTION | ORAL | Status: DC | PRN
  Administered 2018-08-03 – 2018-08-04 (×2): 10 mL via ORAL
  Filled 2018-08-02 (×3): qty 10

## 2018-08-02 MED ORDER — HYDROCOD POLST-CPM POLST ER 10-8 MG/5ML PO SUER
5.0000 mL | Freq: Two times a day (BID) | ORAL | Status: DC | PRN
  Administered 2018-08-03 – 2018-08-05 (×3): 5 mL via ORAL
  Filled 2018-08-02 (×4): qty 5

## 2018-08-02 MED ORDER — ASPIRIN EC 81 MG PO TBEC
81.0000 mg | DELAYED_RELEASE_TABLET | Freq: Every day | ORAL | Status: DC
  Administered 2018-08-03 – 2018-08-06 (×4): 81 mg via ORAL
  Filled 2018-08-02 (×4): qty 1

## 2018-08-02 MED ORDER — ACETAMINOPHEN 325 MG PO TABS
650.0000 mg | ORAL_TABLET | Freq: Four times a day (QID) | ORAL | Status: DC | PRN
  Administered 2018-08-05: 08:00:00 650 mg via ORAL
  Filled 2018-08-02: qty 2

## 2018-08-02 MED ORDER — ONDANSETRON HCL 4 MG/2ML IJ SOLN: 4.0000 mg | Freq: Four times a day (QID) | INTRAMUSCULAR | Status: DC | PRN

## 2018-08-02 MED ORDER — VITAMIN C 500 MG PO TABS
500.0000 mg | ORAL_TABLET | Freq: Every day | ORAL | Status: DC
  Administered 2018-08-02 – 2018-08-06 (×5): 500 mg via ORAL
  Filled 2018-08-02 (×5): qty 1

## 2018-08-02 MED ORDER — SODIUM CHLORIDE 0.9% FLUSH
3.0000 mL | Freq: Two times a day (BID) | INTRAVENOUS | Status: DC
  Administered 2018-08-02 – 2018-08-06 (×8): 3 mL via INTRAVENOUS

## 2018-08-02 NOTE — H&P (Signed)
History and Physical    Beth Mueller MCN:[id number] DOB: 07/02/1950 DOA: 08/02/2018  PCP: Patient, No Pcp Per  Patient coming from: [facility]  Chief Complaint: Shortness of breath  HPI: Beth Mueller is a 68 y.o. female with medical history significant of recent hospitalization for COVID infection was given a dose of Actemra and a course of azithromycin and steroids discharged on 07/29/2018 with normal O2 sats on room air.  Patient presents back today from [facility] emergency department because of worsening shortness of breath and hypoxia and and confusion.  Patient referred to [facility] for recurrent hypoxia with COVID infection.  Patient mildly confused cannot provide accurate history.   Review of Systems: Unobtainable secondary to altered mental status  No past medical history on file.  Hypertension     has no history on file for tobacco, alcohol, and drug.  None  Not on File  No family history on file.  No premature coronary artery disease  Prior to Admission medications   Medication Sig Start Date End Date Taking? Authorizing Provider  acetaminophen (TYLENOL) 500 MG tablet Take 500 mg by mouth every 6 (six) hours as needed for mild pain, fever or headache.    [provider]  aspirin EC 325 MG tablet Take 1 tablet (325 mg total) by mouth 2 (two) times a day for 10 days. 07/29/18 08/08/18  Kathie Dike, MD  azithromycin (ZITHROMAX) 500 MG tablet Take 1 tablet (500 mg total) by mouth daily for 4 days. Take 1 tablet daily for 3 days. 07/29/18 08/02/18  Kathie Dike, MD  chlorpheniramine-HYDROcodone (TUSSIONEX) 10-8 MG/5ML SUER Take 5 mLs by mouth every 12 (twelve) hours as needed for cough (refractory). 07/29/18   Kathie Dike, MD  guaiFENesin (MUCINEX) 600 MG 12 hr tablet Take 1 tablet (600 mg total) by mouth 2 (two) times daily. 07/29/18 07/29/19  Kathie Dike, MD  metoprolol tartrate (LOPRESSOR) 25 MG tablet Take 25 mg by mouth daily.     [provider]  pantoprazole (PROTONIX) 40 MG tablet Take 1 tablet (40 mg total) by mouth daily. 07/29/18 07/29/19  Kathie Dike, MD  predniSONE (DELTASONE) 10 MG tablet Take 40mg  po daily for 2 days then 30mg  daily for 2 days then 20mg  daily for 2 days then 10mg  daily for 2 days then stop 07/29/18   Kathie Dike, MD    Physical Exam: Vitals:   08/02/18 2229  BP: 139/85  Pulse: 72  Resp: (!) 25  Temp: 98 F (36.7 C)  TempSrc: Oral  SpO2: 98%      Constitutional: NAD, calm, comfortable Vitals:   08/02/18 2229  BP: 139/85  Pulse: 72  Resp: (!) 25  Temp: 98 F (36.7 C)  TempSrc: Oral  SpO2: 98%   Eyes: PERRL, lids and conjunctivae normal ENMT: Mucous membranes are moist. Posterior pharynx clear of any exudate or lesions.Normal dentition.  Neck: normal, supple, no masses, no thyromegaly Respiratory: clear to auscultation bilaterally, no wheezing, no crackles. Normal respiratory effort. No accessory muscle use.  Cardiovascular: Regular rate and rhythm, no murmurs / rubs / gallops. No extremity edema. 2+ pedal pulses. No carotid bruits.  Abdomen: no tenderness, no masses palpated. No hepatosplenomegaly. Bowel sounds positive.  Musculoskeletal: no clubbing / cyanosis. No joint deformity upper and lower extremities. Good ROM, no contractures. Normal muscle tone.  Skin: no rashes, lesions, ulcers. No induration Neurologic: CN 2-12 grossly intact. Sensation intact, DTR normal. Strength 5/5 in all 4.  Psychiatric: Alert and oriented x 2.  Normal mood.    Labs on Admission: I have personally reviewed following labs and imaging studies  CBC: Recent Labs  Lab 07/28/18 1545 07/29/18 0332  WBC 6.8 5.0  NEUTROABS 6.0 4.5  HGB 14.5 13.0  HCT 43.8 39.6  MCV 91.3 91.2  PLT 101* 865*   Basic Metabolic Panel: Recent Labs  Lab 07/28/18 1545 07/29/18 0332  NA 138 135  K 4.1 4.2  CL 106 106  CO2 23 21*  GLUCOSE 95 155*  BUN 13 15  CREATININE 0.70 0.60  CALCIUM  8.5* 8.1*   GFR: Estimated Creatinine Clearance: 58.3 mL/min (by C-G formula based on SCr of 0.6 mg/dL). Liver Function Tests: Recent Labs  Lab 07/28/18 1545 07/29/18 0332  AST 30 28  ALT 28 23  ALKPHOS 105 91  BILITOT 1.1 0.7  PROT 6.9 6.4*  ALBUMIN 3.1* 2.8*   No results for input(s): LIPASE, AMYLASE in the last 168 hours. No results for input(s): AMMONIA in the last 168 hours. Coagulation Profile: No results for input(s): INR, PROTIME in the last 168 hours. Cardiac Enzymes: No results for input(s): CKTOTAL, CKMB, CKMBINDEX, TROPONINI in the last 168 hours. BNP (last 3 results) No results for input(s): PROBNP in the last 8760 hours. HbA1C: No results for input(s): HGBA1C in the last 72 hours. CBG: No results for input(s): GLUCAP in the last 168 hours. Lipid Profile: No results for input(s): CHOL, HDL, LDLCALC, TRIG, CHOLHDL, LDLDIRECT in the last 72 hours. Thyroid Function Tests: No results for input(s): TSH, T4TOTAL, FREET4, T3FREE, THYROIDAB in the last 72 hours. Anemia Panel: No results for input(s): VITAMINB12, FOLATE, FERRITIN, TIBC, IRON, RETICCTPCT in the last 72 hours. Urine analysis: No results found for: COLORURINE, APPEARANCEUR, LABSPEC, PHURINE, GLUCOSEU, HGBUR, BILIRUBINUR, KETONESUR, PROTEINUR, UROBILINOGEN, NITRITE, LEUKOCYTESUR Sepsis Labs: !!!!!!!!!!!!!!!!!!!!!!!!!!!!!!!!!!!!!!!!!!!! @LABRCNTIP (procalcitonin:4,lacticidven:4) ) Recent Results (from the past 240 hour(s))  Culture, blood (Routine X 2) w Reflex to ID Panel     Status: Abnormal   Collection Time: 07/28/18  3:14 PM  Result Value Ref Range Status   Specimen Description BLOOD LEFT ANTECUBITAL  Final   Special Requests   Final    BOTTLES DRAWN AEROBIC ONLY Blood Culture adequate volume   Culture  Setup Time   Final    AEROBIC BOTTLE ONLY GRAM POSITIVE COCCI CRITICAL VALUE NOTED.  VALUE IS CONSISTENT WITH PREVIOUSLY REPORTED AND CALLED VALUE.    Culture (A)  Final    STAPHYLOCOCCUS CAPITIS  THE SIGNIFICANCE OF ISOLATING THIS ORGANISM FROM A SINGLE SET OF BLOOD CULTURES WHEN MULTIPLE SETS ARE DRAWN IS UNCERTAIN. PLEASE NOTIFY THE MICROBIOLOGY DEPARTMENT WITHIN ONE WEEK IF SPECIATION AND SENSITIVITIES ARE REQUIRED. Performed at [facility], [place] [address], [place], [place] [address]    Report Status 07/31/2018 FINAL  Final  Culture, blood (Routine X 2) w Reflex to ID Panel     Status: None   Collection Time: 07/28/18  3:17 PM  Result Value Ref Range Status   Specimen Description BLOOD LEFT HAND  Final   Special Requests   Final    BOTTLES DRAWN AEROBIC ONLY Blood Culture adequate volume   Culture  Setup Time   Final    GRAM POSITIVE COCCI AEROBIC BOTTLE ONLY Organism ID to follow CRITICAL RESULT CALLED TO, READ BACK BY AND VERIFIED WITH: PHARMD B MANCHERIL [id number] MLM    Culture   Final    STAPHYLOCOCCUS PASTEURI THE SIGNIFICANCE OF ISOLATING THIS ORGANISM FROM A SINGLE SET OF BLOOD CULTURES WHEN MULTIPLE SETS ARE  DRAWN IS UNCERTAIN. PLEASE NOTIFY THE MICROBIOLOGY DEPARTMENT WITHIN ONE WEEK IF SPECIATION AND SENSITIVITIES ARE REQUIRED. Performed at [facility], [place] [address], [place], [place] [address]    Report Status 07/31/2018 FINAL  Final  Blood Culture ID Panel (Reflexed)     Status: Abnormal   Collection Time: 07/28/18  3:17 PM  Result Value Ref Range Status   Enterococcus species NOT DETECTED NOT DETECTED Final   Listeria monocytogenes NOT DETECTED NOT DETECTED Final   Staphylococcus species DETECTED (A) NOT DETECTED Final    Comment: Methicillin (oxacillin) susceptible coagulase negative staphylococcus. Possible blood culture contaminant (unless isolated from more than one blood culture draw or clinical case suggests pathogenicity). No antibiotic treatment is indicated for blood  culture contaminants. CRITICAL RESULT CALLED TO, READ BACK BY AND VERIFIED WITH: PHARMD B MANCHERIL [id number] MLM    Staphylococcus aureus (BCID) NOT DETECTED NOT  DETECTED Final   Methicillin resistance NOT DETECTED NOT DETECTED Final   Streptococcus species NOT DETECTED NOT DETECTED Final   Streptococcus agalactiae NOT DETECTED NOT DETECTED Final   Streptococcus pneumoniae NOT DETECTED NOT DETECTED Final   Streptococcus pyogenes NOT DETECTED NOT DETECTED Final   Acinetobacter baumannii NOT DETECTED NOT DETECTED Final   Enterobacteriaceae species NOT DETECTED NOT DETECTED Final   Enterobacter cloacae complex NOT DETECTED NOT DETECTED Final   Escherichia coli NOT DETECTED NOT DETECTED Final   Klebsiella oxytoca NOT DETECTED NOT DETECTED Final   Klebsiella pneumoniae NOT DETECTED NOT DETECTED Final   Proteus species NOT DETECTED NOT DETECTED Final   Serratia marcescens NOT DETECTED NOT DETECTED Final   Haemophilus influenzae NOT DETECTED NOT DETECTED Final   Neisseria meningitidis NOT DETECTED NOT DETECTED Final   Pseudomonas aeruginosa NOT DETECTED NOT DETECTED Final   Candida albicans NOT DETECTED NOT DETECTED Final   Candida glabrata NOT DETECTED NOT DETECTED Final   Candida krusei NOT DETECTED NOT DETECTED Final   Candida parapsilosis NOT DETECTED NOT DETECTED Final   Candida tropicalis NOT DETECTED NOT DETECTED Final    Comment: Performed at [facility], [place]. [address], [place],  [address]     Radiological Exams on Admission: No results found.  Old chart reviewed   Assessment/Plan 68 year old female with acute hypoxic respiratory failure secondary to COVID infection and encephalopathy Principal Problem:   Acute respiratory failure with hypoxia (HCC)-supplemental oxygen and wean as necessary.  Patient ambulatory to bathroom while ago and desatted to 79% on room air.  Active Problems:   HTN (hypertension)-clarify home meds and resume    COVID-19 virus infection-patient already received a dose of Actemra and a steroid taper unclear if she completed the azithromycin and steroids at discharge.  Check COVID labs at this time  including a procalcitonin level.  Lactic acid level.  Lung exam is clear patient in no respiratory distress.  Supportive care.   Home med list pending pharmacy review  DVT prophylaxis: Lovenox Code Status: Full Family Communication: None Disposition Plan: Days Consults called: None Admission status: Admission   DAVID,RACHAL A MD [facility]  If 7PM-7AM, please contact night-coverage [email or web address] Password [facility]  08/02/2018, 10:46 PM

## 2018-08-03 DIAGNOSIS — I10 Essential (primary) hypertension: Secondary | ICD-10-CM

## 2018-08-03 LAB — CBC WITH DIFFERENTIAL/PLATELET
Abs Immature Granulocytes: 0.23 10*3/uL — ABNORMAL HIGH (ref 0.00–0.07)
Basophils Absolute: 0 10*3/uL (ref 0.0–0.1)
Basophils Relative: 1 %
Eosinophils Absolute: 0 10*3/uL (ref 0.0–0.5)
Eosinophils Relative: 0 %
HCT: 42.3 % (ref 36.0–46.0)
Hemoglobin: 14.1 g/dL (ref 12.0–15.0)
Immature Granulocytes: 4 %
Lymphocytes Relative: 19 %
Lymphs Abs: 1 10*3/uL (ref 0.7–4.0)
MCH: 29.7 pg (ref 26.0–34.0)
MCHC: 33.3 g/dL (ref 30.0–36.0)
MCV: 89.2 fL (ref 80.0–100.0)
Monocytes Absolute: 0.4 10*3/uL (ref 0.1–1.0)
Monocytes Relative: 8 %
Neutro Abs: 3.6 10*3/uL (ref 1.7–7.7)
Neutrophils Relative %: 68 %
Platelets: 154 10*3/uL (ref 150–400)
RBC: 4.74 MIL/uL (ref 3.87–5.11)
RDW: 13.3 % (ref 11.5–15.5)
WBC: 5.3 10*3/uL (ref 4.0–10.5)
nRBC: 0 % (ref 0.0–0.2)

## 2018-08-03 LAB — COMPREHENSIVE METABOLIC PANEL
ALT: 99 U/L — ABNORMAL HIGH (ref 0–44)
AST: 73 U/L — ABNORMAL HIGH (ref 15–41)
Albumin: 3.1 g/dL — ABNORMAL LOW (ref 3.5–5.0)
Alkaline Phosphatase: 94 U/L (ref 38–126)
Anion gap: 9 (ref 5–15)
BUN: 17 mg/dL (ref 8–23)
CO2: 23 mmol/L (ref 22–32)
Calcium: 8.4 mg/dL — ABNORMAL LOW (ref 8.9–10.3)
Chloride: 105 mmol/L (ref 98–111)
Creatinine, Ser: 0.71 mg/dL (ref 0.44–1.00)
GFR calc Af Amer: >60 mL/min (ref >60)
GFR calc non Af Amer: >60 mL/min (ref >60)
Glucose, Bld: 102 mg/dL — ABNORMAL HIGH (ref 70–99)
Potassium: 3.9 mmol/L (ref 3.5–5.1)
Sodium: 137 mmol/L (ref 135–145)
Total Bilirubin: 0.8 mg/dL (ref 0.3–1.2)
Total Protein: 6.2 g/dL — ABNORMAL LOW (ref 6.5–8.1)

## 2018-08-03 LAB — BRAIN NATRIURETIC PEPTIDE: B Natriuretic Peptide: 43.4 pg/mL (ref 0.0–100.0)

## 2018-08-03 LAB — C-REACTIVE PROTEIN: CRP: <0.8 mg/dL (ref <1.0)

## 2018-08-03 LAB — PROCALCITONIN: Procalcitonin: <0.1 ng/mL

## 2018-08-03 LAB — LACTATE DEHYDROGENASE: LDH: 220 U/L — ABNORMAL HIGH (ref 98–192)

## 2018-08-03 LAB — FERRITIN: Ferritin: 249 ng/mL (ref 11–307)

## 2018-08-03 LAB — D-DIMER, QUANTITATIVE: D-Dimer, Quant: 9.22 ug/mL-FEU — ABNORMAL HIGH (ref 0.00–0.50)

## 2018-08-03 MED ORDER — HYDROMORPHONE HCL 1 MG/ML IJ SOLN
1.0000 mg | Freq: Once | INTRAMUSCULAR | Status: AC
  Administered 2018-08-03: 01:00:00 1 mg via INTRAVENOUS
  Filled 2018-08-03: qty 1

## 2018-08-03 MED ORDER — ENOXAPARIN SODIUM 40 MG/0.4ML ~~LOC~~ SOLN
40.0000 mg | Freq: Two times a day (BID) | SUBCUTANEOUS | Status: DC
  Administered 2018-08-03 – 2018-08-06 (×7): 40 mg via SUBCUTANEOUS
  Filled 2018-08-03 (×7): qty 0.4

## 2018-08-03 MED ORDER — TOCILIZUMAB 400 MG/20ML IV SOLN
8.0000 mg/kg | Freq: Once | INTRAVENOUS | Status: AC
  Administered 2018-08-03: 09:00:00 508 mg via INTRAVENOUS
  Filled 2018-08-03: qty 25.4

## 2018-08-03 MED ORDER — METHYLPREDNISOLONE SODIUM SUCC 125 MG IJ SOLR
60.0000 mg | Freq: Three times a day (TID) | INTRAMUSCULAR | Status: AC
  Administered 2018-08-03 – 2018-08-04 (×6): 60 mg via INTRAVENOUS
  Filled 2018-08-03 (×6): qty 2

## 2018-08-03 NOTE — Progress Notes (Signed)
CardioVascular Rewsearch Department and AHF Team  ReDS Research Project   Patient #: [id number]  ReDS Measurement  Right: 32%  Left: 38%

## 2018-08-03 NOTE — Progress Notes (Signed)
[facility] PROGRESS NOTE    Progress Note  Beth Mueller  OYD:[id number] DOB: 02-10-1951 DOA: 08/02/2018 PCP: Patient, No Pcp Per     Brief Narrative:   Beth Mueller is an 68 y.o. female past medical history of hypertension recently discharged from the [facility] on 07/29/2018 satting on room air, during this time he received steroids Actemra on a course of azithromycin, patient return to [facility] because of worsening shortness of breath and confusion in [facility] he was found to be significantly hypoxic and tachypneic.  Assessment/Plan:   Acute respiratory failure with hypoxia (HCC) due to COVID-19 infection: Symptoms started on 5.13.2020 Desaturating with ambulation going to the bathroom. She has received 2 doses of Actemra and currently on IV steroids. Back in 07/28/2018 BC ID and blood culture that showed staph capitis with 1 out of 2 which is probably a contaminant. She relates her breathing is unchanged compared to yesterday, although she is satting 90% on room air with no respiratory distress.  Essential HTN (hypertension) Continea current regimen  DVT prophylaxis: lovexno Family Communication:None Disposition Plan/Barrier to D/C: unable to determine Code Status:     Code Status Orders  (From admission, onward)         Start     Ordered   08/02/18 2245  Full code  Continuous     08/02/18 2246        Code Status History    Date Active Date Inactive Code Status Order ID Comments User Context   07/28/2018 1510 07/29/2018 2135 Full Code [id number]  Kathie Dike, MD Inpatient        IV Access:    Peripheral IV   Procedures and diagnostic studies:   No results found.   Medical Consultants:    None.  Anti-Infectives:   None  Subjective:    Beth Mueller she relates her breathing is not improved compared to yesterday.  Objective:    Vitals:   08/02/18 2359 08/03/18 0135 08/03/18 0255  08/03/18 0523  BP: 137/73   130/71  Pulse: 70   75  Resp: 20     Temp: 98.2 F (36.8 C)   98.7 F (37.1 C)  TempSrc: Oral   Oral  SpO2: 93% (!) 89% 94% 94%   No intake or output data in the 24 hours ending 08/03/18 0705 There were no vitals filed for this visit.  Exam: General exam: In no acute distress. Respiratory system: Good air movement and diffuse crackles bilaterally. Cardiovascular system: S1 & S2 heard, RRR.  Gastrointestinal system: Abdomen is nondistended, soft and nontender.  Central nervous system: Alert and oriented. No focal neurological deficits. Extremities: No pedal edema. Skin: No rashes, lesions or ulcers Psychiatry: Judgement and insight appear normal. Mood & affect appropriate.     Data Reviewed:    Labs: Basic Metabolic Panel: Recent Labs  Lab 07/28/18 1545 07/29/18 0332 08/03/18 0022  NA 138 135 137  K 4.1 4.2 3.9  CL 106 106 105  CO2 23 21* 23  GLUCOSE 95 155* 102*  BUN 13 15 17   CREATININE 0.70 0.60 0.71  CALCIUM 8.5* 8.1* 8.4*   GFR Estimated Creatinine Clearance: 58.3 mL/min (by C-G formula based on SCr of 0.71 mg/dL). Liver Function Tests: Recent Labs  Lab 07/28/18 1545 07/29/18 0332 08/03/18 0022  AST 30 28 73*  ALT 28 23 99*  ALKPHOS 105 91 94  BILITOT 1.1 0.7 0.8  PROT 6.9 6.4* 6.2*  ALBUMIN 3.1* 2.8* 3.1*  No results for input(s): LIPASE, AMYLASE in the last 168 hours. No results for input(s): AMMONIA in the last 168 hours. Coagulation profile No results for input(s): INR, PROTIME in the last 168 hours. COVID-19 Labs  Recent Labs    08/03/18 0022  DDIMER 9.22*  FERRITIN 249  LDH 220*  CRP <0.8    No results found for: SARSCOV2NAA  CBC: Recent Labs  Lab 07/28/18 1545 07/29/18 0332 08/03/18 0022  WBC 6.8 5.0 5.3  NEUTROABS 6.0 4.5 3.6  HGB 14.5 13.0 14.1  HCT 43.8 39.6 42.3  MCV 91.3 91.2 89.2  PLT 101* 108* 154   Cardiac Enzymes: No results for input(s): CKTOTAL, CKMB, CKMBINDEX, TROPONINI in  the last 168 hours. BNP (last 3 results) No results for input(s): PROBNP in the last 8760 hours. CBG: No results for input(s): GLUCAP in the last 168 hours. D-Dimer: Recent Labs    08/03/18 0022  DDIMER 9.22*   Hgb A1c: No results for input(s): HGBA1C in the last 72 hours. Lipid Profile: No results for input(s): CHOL, HDL, LDLCALC, TRIG, CHOLHDL, LDLDIRECT in the last 72 hours. Thyroid function studies: No results for input(s): TSH, T4TOTAL, T3FREE, THYROIDAB in the last 72 hours.  Invalid input(s): FREET3 Anemia work up: Recent Labs    08/03/18 0022  FERRITIN 249   Sepsis Labs: Recent Labs  Lab 07/28/18 1545 07/29/18 0332 08/03/18 0022  PROCALCITON  --   --  <0.10  WBC 6.8 5.0 5.3   Microbiology Recent Results (from the past 240 hour(s))  Culture, blood (Routine X 2) w Reflex to ID Panel     Status: Abnormal   Collection Time: 07/28/18  3:14 PM  Result Value Ref Range Status   Specimen Description BLOOD LEFT ANTECUBITAL  Final   Special Requests   Final    BOTTLES DRAWN AEROBIC ONLY Blood Culture adequate volume   Culture  Setup Time   Final    AEROBIC BOTTLE ONLY GRAM POSITIVE COCCI CRITICAL VALUE NOTED.  VALUE IS CONSISTENT WITH PREVIOUSLY REPORTED AND CALLED VALUE.    Culture (A)  Final    STAPHYLOCOCCUS CAPITIS THE SIGNIFICANCE OF ISOLATING THIS ORGANISM FROM A SINGLE SET OF BLOOD CULTURES WHEN MULTIPLE SETS ARE DRAWN IS UNCERTAIN. PLEASE NOTIFY THE MICROBIOLOGY DEPARTMENT WITHIN ONE WEEK IF SPECIATION AND SENSITIVITIES ARE REQUIRED. Performed at [facility], [place] [address], [place], [place] [address]    Report Status 07/31/2018 FINAL  Final  Culture, blood (Routine X 2) w Reflex to ID Panel     Status: None   Collection Time: 07/28/18  3:17 PM  Result Value Ref Range Status   Specimen Description BLOOD LEFT HAND  Final   Special Requests   Final    BOTTLES DRAWN AEROBIC ONLY Blood Culture adequate volume   Culture  Setup Time   Final    GRAM  POSITIVE COCCI AEROBIC BOTTLE ONLY Organism ID to follow CRITICAL RESULT CALLED TO, READ BACK BY AND VERIFIED WITH: PHARMD B MANCHERIL [id number] MLM    Culture   Final    STAPHYLOCOCCUS PASTEURI THE SIGNIFICANCE OF ISOLATING THIS ORGANISM FROM A SINGLE SET OF BLOOD CULTURES WHEN MULTIPLE SETS ARE DRAWN IS UNCERTAIN. PLEASE NOTIFY THE MICROBIOLOGY DEPARTMENT WITHIN ONE WEEK IF SPECIATION AND SENSITIVITIES ARE REQUIRED. Performed at [facility], [place] [address], [place], [place] [address]    Report Status 07/31/2018 FINAL  Final  Blood Culture ID Panel (Reflexed)     Status: Abnormal   Collection Time:  07/28/18  3:17 PM  Result Value Ref Range Status   Enterococcus species NOT DETECTED NOT DETECTED Final   Listeria monocytogenes NOT DETECTED NOT DETECTED Final   Staphylococcus species DETECTED (A) NOT DETECTED Final    Comment: Methicillin (oxacillin) susceptible coagulase negative staphylococcus. Possible blood culture contaminant (unless isolated from more than one blood culture draw or clinical case suggests pathogenicity). No antibiotic treatment is indicated for blood  culture contaminants. CRITICAL RESULT CALLED TO, READ BACK BY AND VERIFIED WITH: PHARMD B MANCHERIL [id number] MLM    Staphylococcus aureus (BCID) NOT DETECTED NOT DETECTED Final   Methicillin resistance NOT DETECTED NOT DETECTED Final   Streptococcus species NOT DETECTED NOT DETECTED Final   Streptococcus agalactiae NOT DETECTED NOT DETECTED Final   Streptococcus pneumoniae NOT DETECTED NOT DETECTED Final   Streptococcus pyogenes NOT DETECTED NOT DETECTED Final   Acinetobacter baumannii NOT DETECTED NOT DETECTED Final   Enterobacteriaceae species NOT DETECTED NOT DETECTED Final   Enterobacter cloacae complex NOT DETECTED NOT DETECTED Final   Escherichia coli NOT DETECTED NOT DETECTED Final   Klebsiella oxytoca NOT DETECTED NOT DETECTED Final   Klebsiella pneumoniae NOT DETECTED NOT DETECTED Final    Proteus species NOT DETECTED NOT DETECTED Final   Serratia marcescens NOT DETECTED NOT DETECTED Final   Haemophilus influenzae NOT DETECTED NOT DETECTED Final   Neisseria meningitidis NOT DETECTED NOT DETECTED Final   Pseudomonas aeruginosa NOT DETECTED NOT DETECTED Final   Candida albicans NOT DETECTED NOT DETECTED Final   Candida glabrata NOT DETECTED NOT DETECTED Final   Candida krusei NOT DETECTED NOT DETECTED Final   Candida parapsilosis NOT DETECTED NOT DETECTED Final   Candida tropicalis NOT DETECTED NOT DETECTED Final    Comment: Performed at [facility], [place]. [address], [place], [place] [address]     Medications:   . aspirin EC  81 mg Oral Daily  . enoxaparin (LOVENOX) injection  40 mg Subcutaneous Q12H  . sodium chloride flush  3 mL Intravenous Q12H  . vitamin C  500 mg Oral Daily  . zinc sulfate  220 mg Oral Daily   Continuous Infusions: . sodium chloride       LOS: 1 day   Charlynne Cousins  [facility]  08/03/2018, 7:05 AM

## 2018-08-03 NOTE — Progress Notes (Signed)
Patient arrived on unit from [facility].  Vitals are stable and she is on 1L of oxygen.  She ambulated independently to bathroom and showed dyspnea with exertion desating to 79% on room air. Patient was placed back on oxygen.   With interpreter at bedside RN spoke with patient.  Patient stated she tested positive for Covid and was feeling worse, so she decided to come back to hospital.  She is feeling SOB especially when she ambulates and also had difficulty eating due to SOB.  She feels a little pain in throat, Headache, and also left quadrant abdominal pain.  The abdominal pain just started today and she described this as a burning pain.  Patient stated I think I have a hernia. RN asked patient if she has any medical history or diagnosis.  Patient stated she did not.  However upon head to toe assessment this RN noticed patient's left breast was removed.  RN asked the patient if she had surgery on her breast.  Patient stated yes she had the left one removed.  RN asked patient if she had breast cancer and patient stated yes 15 years ago.  Patient also added that she had a hysterectomy and her ovaries were removed at this same time.  RN again asked if patient has any other medication history.  Patient stated no.  Day RN did receive in report patient had a history of HTN, and GERD.  She had previously been admitted at [facility] and [facility] 5/15.  Patients Husband is in ICU here and her daughter at home also has Covid.  Called daughter and left message letting her know patient arrived.

## 2018-08-03 NOTE — Progress Notes (Signed)
Pharmacy Note    Will adjust enoxaparin dosing to 40 mg BID as pt D-dimer is 9.22,CrCl is 59 ml/min.    Royetta Asal, PharmD, BCPS 08/03/2018 3:05 AM

## 2018-08-03 NOTE — Progress Notes (Signed)
Ambulated 400 ft on room air, sats dropped to 86%, recovered quickly at rest without 02

## 2018-08-04 ENCOUNTER — Inpatient Hospital Stay (HOSPITAL_COMMUNITY): Payer: HRSA Program

## 2018-08-04 DIAGNOSIS — R109 Unspecified abdominal pain: Secondary | ICD-10-CM | POA: Diagnosis present

## 2018-08-04 LAB — COMPREHENSIVE METABOLIC PANEL
ALT: 83 U/L — ABNORMAL HIGH (ref 0–44)
AST: 42 U/L — ABNORMAL HIGH (ref 15–41)
Albumin: 3.1 g/dL — ABNORMAL LOW (ref 3.5–5.0)
Alkaline Phosphatase: 91 U/L (ref 38–126)
Anion gap: 8 (ref 5–15)
BUN: 18 mg/dL (ref 8–23)
CO2: 21 mmol/L — ABNORMAL LOW (ref 22–32)
Calcium: 8.4 mg/dL — ABNORMAL LOW (ref 8.9–10.3)
Chloride: 106 mmol/L (ref 98–111)
Creatinine, Ser: 0.64 mg/dL (ref 0.44–1.00)
GFR calc Af Amer: >60 mL/min (ref >60)
GFR calc non Af Amer: >60 mL/min (ref >60)
Glucose, Bld: 155 mg/dL — ABNORMAL HIGH (ref 70–99)
Potassium: 4 mmol/L (ref 3.5–5.1)
Sodium: 135 mmol/L (ref 135–145)
Total Bilirubin: 0.8 mg/dL (ref 0.3–1.2)
Total Protein: 6.3 g/dL — ABNORMAL LOW (ref 6.5–8.1)

## 2018-08-04 LAB — CBC WITH DIFFERENTIAL/PLATELET
Abs Immature Granulocytes: 0.25 10*3/uL — ABNORMAL HIGH (ref 0.00–0.07)
Basophils Absolute: 0 10*3/uL (ref 0.0–0.1)
Basophils Relative: 1 %
Eosinophils Absolute: 0 10*3/uL (ref 0.0–0.5)
Eosinophils Relative: 0 %
HCT: 41.6 % (ref 36.0–46.0)
Hemoglobin: 14.2 g/dL (ref 12.0–15.0)
Immature Granulocytes: 4 %
Lymphocytes Relative: 10 %
Lymphs Abs: 0.7 10*3/uL (ref 0.7–4.0)
MCH: 30 pg (ref 26.0–34.0)
MCHC: 34.1 g/dL (ref 30.0–36.0)
MCV: 87.9 fL (ref 80.0–100.0)
Monocytes Absolute: 0.3 10*3/uL (ref 0.1–1.0)
Monocytes Relative: 4 %
Neutro Abs: 5.9 10*3/uL (ref 1.7–7.7)
Neutrophils Relative %: 81 %
Platelets: 186 10*3/uL (ref 150–400)
RBC: 4.73 MIL/uL (ref 3.87–5.11)
RDW: 13.1 % (ref 11.5–15.5)
WBC: 7.2 10*3/uL (ref 4.0–10.5)
nRBC: 0 % (ref 0.0–0.2)

## 2018-08-04 MED ORDER — SORBITOL 70 % SOLN
30.0000 mL | Freq: Once | Status: DC
  Filled 2018-08-04: qty 30

## 2018-08-04 MED ORDER — POLYETHYLENE GLYCOL 3350 17 G PO PACK
17.0000 g | PACK | Freq: Two times a day (BID) | ORAL | Status: AC
  Administered 2018-08-04 (×2): 17 g via ORAL
  Filled 2018-08-04 (×2): qty 1

## 2018-08-04 NOTE — Progress Notes (Addendum)
[facility] PROGRESS NOTE    Progress Note  Beth Mueller  VZC:[id number] DOB: Aug 08, 1950 DOA: 08/02/2018 PCP: Patient, No Pcp Per     Brief Narrative:   Beth Mueller is an 68 y.o. female past medical history of hypertension recently discharged from the [facility] on 07/29/2018 satting on room air, during this time he received steroids Actemra on a course of azithromycin, patient return to [facility] because of worsening shortness of breath and confusion in [facility] he was found to be significantly hypoxic and tachypneic.  Medications: 08/02/2020 08/06/2018 IV Solu-Medrol 07/28/2018 and 08/03/2018 Actemra   Assessment/Plan:   Acute respiratory failure with hypoxia ([place]) due to COVID-19 infection: Symptoms started on 5.13.2020 Her saturations have remained stable on room air she does not desat below 88% when she ambulates. Back in 07/28/2018 BC ID and blood culture that showed staph capitis with 1 out of 2 which is probably a contaminant.  Essential HTN (hypertension): Continea current regimen.  New abd pain: Last BM 4 days ago, start miralax. No rebound or guarding on physical exam, will check a KUB and a chest x-ray to rule out a pleural effusion. Likely due to constipation vs less likely deferred pain from questionable pneumonia and/or pleurisy we will check a chest x-ray.  DVT prophylaxis: lovexno Family Communication:None Disposition Plan/Barrier to D/C: unable to determine Code Status:     Code Status Orders  (From admission, onward)         Start     Ordered   08/02/18 2245  Full code  Continuous     08/02/18 2246        Code Status History    Date Active Date Inactive Code Status Order ID Comments User Context   07/28/2018 1510 07/29/2018 2135 Full Code [id number]  Kathie Dike, MD Inpatient        IV Access:    Peripheral IV   Procedures and diagnostic studies:   No results found.   Medical Consultants:     None.  Anti-Infectives:   None  Subjective:    Beth Mueller she relates her breathing is compared to yesterday, but she is now having a left upper and lower quadrant abdominal pain  Objective:    Vitals:   08/03/18 1953 08/04/18 0006 08/04/18 0537 08/04/18 0749  BP: 130/77 136/79  (!) 142/83  Pulse:  74  85  Resp:  (!) 27  20  Temp: 98.6 F (37 C) 98.5 F (36.9 C) 98.5 F (36.9 C)   TempSrc: Oral Oral Oral   SpO2:  92%  91%    Intake/Output Summary (Last 24 hours) at 08/04/2018 0800 Last data filed at 08/04/2018 0200 Gross per 24 hour  Intake 118.65 ml  Output 450 ml  Net -331.35 ml   There were no vitals filed for this visit.  Exam: General exam: In no acute distress. Respiratory system: Good air movement and clear to auscultation. Cardiovascular system: S1 & S2 heard, RRR.  Gastrointestinal system: Positive bowel sounds soft nondistended, with deep left upper and lower quadrant pain Central nervous system: Alert and oriented. No focal neurological deficits. Extremities: No pedal edema. Skin: No rashes, lesions or ulcers Psychiatry: Judgement and insight appear normal. Mood & affect appropriate.      Data Reviewed:    Labs: Basic Metabolic Panel: Recent Labs  Lab 07/28/18 1545 07/29/18 0332 08/03/18 0022 08/04/18 0538  NA 138 135 137 135  K 4.1 4.2 3.9 4.0  CL 106  106 105 106  CO2 23 21* 23 21*  GLUCOSE 95 155* 102* 155*  BUN 13 15 17 18   CREATININE 0.70 0.60 0.71 0.64  CALCIUM 8.5* 8.1* 8.4* 8.4*   GFR Estimated Creatinine Clearance: 58.3 mL/min (by C-G formula based on SCr of 0.64 mg/dL). Liver Function Tests: Recent Labs  Lab 07/28/18 1545 07/29/18 0332 08/03/18 0022 08/04/18 0538  AST 30 28 73* 42*  ALT 28 23 99* 83*  ALKPHOS 105 91 94 91  BILITOT 1.1 0.7 0.8 0.8  PROT 6.9 6.4* 6.2* 6.3*  ALBUMIN 3.1* 2.8* 3.1* 3.1*   No results for input(s): LIPASE, AMYLASE in the last 168 hours. No results for input(s): AMMONIA in  the last 168 hours. Coagulation profile No results for input(s): INR, PROTIME in the last 168 hours. COVID-19 Labs  Recent Labs    08/03/18 0022  DDIMER 9.22*  FERRITIN 249  LDH 220*  CRP <0.8    No results found for: SARSCOV2NAA  CBC: Recent Labs  Lab 07/28/18 1545 07/29/18 0332 08/03/18 0022 08/04/18 0538  WBC 6.8 5.0 5.3 7.2  NEUTROABS 6.0 4.5 3.6 5.9  HGB 14.5 13.0 14.1 14.2  HCT 43.8 39.6 42.3 41.6  MCV 91.3 91.2 89.2 87.9  PLT 101* 108* 154 186   Cardiac Enzymes: No results for input(s): CKTOTAL, CKMB, CKMBINDEX, TROPONINI in the last 168 hours. BNP (last 3 results) No results for input(s): PROBNP in the last 8760 hours. CBG: No results for input(s): GLUCAP in the last 168 hours. D-Dimer: Recent Labs    08/03/18 0022  DDIMER 9.22*   Hgb A1c: No results for input(s): HGBA1C in the last 72 hours. Lipid Profile: No results for input(s): CHOL, HDL, LDLCALC, TRIG, CHOLHDL, LDLDIRECT in the last 72 hours. Thyroid function studies: No results for input(s): TSH, T4TOTAL, T3FREE, THYROIDAB in the last 72 hours.  Invalid input(s): FREET3 Anemia work up: Recent Labs    08/03/18 0022  FERRITIN 249   Sepsis Labs: Recent Labs  Lab 07/28/18 1545 07/29/18 0332 08/03/18 0022 08/04/18 0538  PROCALCITON  --   --  <0.10  --   WBC 6.8 5.0 5.3 7.2   Microbiology Recent Results (from the past 240 hour(s))  Culture, blood (Routine X 2) w Reflex to ID Panel     Status: Abnormal   Collection Time: 07/28/18  3:14 PM  Result Value Ref Range Status   Specimen Description BLOOD LEFT ANTECUBITAL  Final   Special Requests   Final    BOTTLES DRAWN AEROBIC ONLY Blood Culture adequate volume   Culture  Setup Time   Final    AEROBIC BOTTLE ONLY GRAM POSITIVE COCCI CRITICAL VALUE NOTED.  VALUE IS CONSISTENT WITH PREVIOUSLY REPORTED AND CALLED VALUE.    Culture (A)  Final    STAPHYLOCOCCUS CAPITIS THE SIGNIFICANCE OF ISOLATING THIS ORGANISM FROM A SINGLE SET OF BLOOD  CULTURES WHEN MULTIPLE SETS ARE DRAWN IS UNCERTAIN. PLEASE NOTIFY THE MICROBIOLOGY DEPARTMENT WITHIN ONE WEEK IF SPECIATION AND SENSITIVITIES ARE REQUIRED. Performed at [facility], [place] [address] [place], [place] [address]    Report Status 07/31/2018 FINAL  Final  Culture, blood (Routine X 2) w Reflex to ID Panel     Status: None   Collection Time: 07/28/18  3:17 PM  Result Value Ref Range Status   Specimen Description BLOOD LEFT HAND  Final   Special Requests   Final    BOTTLES DRAWN AEROBIC ONLY Blood Culture adequate volume   Culture  Setup  Time   Final    GRAM POSITIVE COCCI AEROBIC BOTTLE ONLY Organism ID to follow CRITICAL RESULT CALLED TO, READ BACK BY AND VERIFIED WITH: PHARMD B MANCHERIL [id number] MLM    Culture   Final    STAPHYLOCOCCUS PASTEURI THE SIGNIFICANCE OF ISOLATING THIS ORGANISM FROM A SINGLE SET OF BLOOD CULTURES WHEN MULTIPLE SETS ARE DRAWN IS UNCERTAIN. PLEASE NOTIFY THE MICROBIOLOGY DEPARTMENT WITHIN ONE WEEK IF SPECIATION AND SENSITIVITIES ARE REQUIRED. Performed at [facility], [place] [address], [place],  [address]    Report Status 07/31/2018 FINAL  Final  Blood Culture ID Panel (Reflexed)     Status: Abnormal   Collection Time: 07/28/18  3:17 PM  Result Value Ref Range Status   Enterococcus species NOT DETECTED NOT DETECTED Final   Listeria monocytogenes NOT DETECTED NOT DETECTED Final   Staphylococcus species DETECTED (A) NOT DETECTED Final    Comment: Methicillin (oxacillin) susceptible coagulase negative staphylococcus. Possible blood culture contaminant (unless isolated from more than one blood culture draw or clinical case suggests pathogenicity). No antibiotic treatment is indicated for blood  culture contaminants. CRITICAL RESULT CALLED TO, READ BACK BY AND VERIFIED WITH: PHARMD B MANCHERIL [id number] MLM    Staphylococcus aureus (BCID) NOT DETECTED NOT DETECTED Final   Methicillin resistance NOT DETECTED NOT DETECTED Final    Streptococcus species NOT DETECTED NOT DETECTED Final   Streptococcus agalactiae NOT DETECTED NOT DETECTED Final   Streptococcus pneumoniae NOT DETECTED NOT DETECTED Final   Streptococcus pyogenes NOT DETECTED NOT DETECTED Final   Acinetobacter baumannii NOT DETECTED NOT DETECTED Final   Enterobacteriaceae species NOT DETECTED NOT DETECTED Final   Enterobacter cloacae complex NOT DETECTED NOT DETECTED Final   Escherichia coli NOT DETECTED NOT DETECTED Final   Klebsiella oxytoca NOT DETECTED NOT DETECTED Final   Klebsiella pneumoniae NOT DETECTED NOT DETECTED Final   Proteus species NOT DETECTED NOT DETECTED Final   Serratia marcescens NOT DETECTED NOT DETECTED Final   Haemophilus influenzae NOT DETECTED NOT DETECTED Final   Neisseria meningitidis NOT DETECTED NOT DETECTED Final   Pseudomonas aeruginosa NOT DETECTED NOT DETECTED Final   Candida albicans NOT DETECTED NOT DETECTED Final   Candida glabrata NOT DETECTED NOT DETECTED Final   Candida krusei NOT DETECTED NOT DETECTED Final   Candida parapsilosis NOT DETECTED NOT DETECTED Final   Candida tropicalis NOT DETECTED NOT DETECTED Final    Comment: Performed at [facility], [place]. [address], [place], [place] [address]     Medications:   . aspirin EC  81 mg Oral Daily  . enoxaparin (LOVENOX) injection  40 mg Subcutaneous Q12H  . methylPREDNISolone (SOLU-MEDROL) injection  60 mg Intravenous Q8H  . sodium chloride flush  3 mL Intravenous Q12H  . vitamin C  500 mg Oral Daily  . zinc sulfate  220 mg Oral Daily   Continuous Infusions: . sodium chloride 250 mL (08/03/18 0900)     LOS: 2 days   Charlynne Cousins  [facility]  08/04/2018, 8:00 AM

## 2018-08-04 NOTE — TOC Initial Note (Signed)
Transition of Care [facility]) - Initial/Assessment Note    Patient Details  Name: Naleyah Ohlinger MRN: [id number] Date of Birth: June 13, 1950  Transition of Care [facility]) CM/SW Contact:    Midge Minium RN, BSN, NCM-BC, ACM-RN [phone] Phone Number: 08/04/2018, 4:33 PM  Clinical Narrative:                 CM following for dispositional needs. Patient is a readmit, who recently discharged from [facility] on 07/29/18. Patient returned d/t worsening SOB and confusion. Patient lives at home with her daughter, [name]. CM has arranged a hospital f/u televist appointment on 08/12/18 @ 0930; AVS. CM team will continue to follow.    Expected Discharge Plan: Home/Self Care Barriers to Discharge: Continued Medical Work up   Expected Discharge Plan and Services Expected Discharge Plan: Home/Self Care In-house Referral: NA Discharge Planning Services: Follow-up appt scheduled, [facility], CM Consult   Living arrangements for the past 2 months: [place]                      Prior Living Arrangements/Services Living arrangements for the past 2 months: Single Family Home Lives with:: Self, Adult Children Patient language and need for interpreter reviewed:: Yes         Activities of Daily Living   ADL Screening (condition at time of admission) Patient's cognitive ability adequate to safely complete daily activities?: Yes Is the patient deaf or have difficulty hearing?: No Does the patient have difficulty seeing, even when wearing glasses/contacts?: No Does the patient have difficulty concentrating, remembering, or making decisions?: No Patient able to express need for assistance with ADLs?: Yes Does the patient have difficulty dressing or bathing?: No Independently performs ADLs?: Yes (appropriate for developmental age) Does the patient have difficulty walking or climbing stairs?: No Weakness of Legs: None Weakness of Arms/Hands: None   Admission diagnosis:  COVID-19  VIRUS INFECTION Patient Active Problem List   Diagnosis Date Noted  . Abdominal pain 08/04/2018  . Acute respiratory failure with hypoxia ([place]) 07/28/2018  . HTN (hypertension) 07/28/2018  . COVID-19 virus infection 07/28/2018  . Thrombocytopenia ([place]) 07/28/2018   PCP:  Patient, No Pcp Per Pharmacy:   [place] [address], [place] [address] Phone: [phone] Fax: [phone]     Social Determinants of Health (SDOH) Interventions    Readmission Risk Interventions No flowsheet data found.

## 2018-08-04 NOTE — Progress Notes (Addendum)
CardioVascular Research Department and AHF Team  ReDS Research Project   Patient #: [id number]  ReDS Measurement  Right:  29 %  Left: 28 %

## 2018-08-05 LAB — CBC WITH DIFFERENTIAL/PLATELET
Abs Immature Granulocytes: 0.4 10*3/uL — ABNORMAL HIGH (ref 0.00–0.07)
Basophils Absolute: 0 10*3/uL (ref 0.0–0.1)
Basophils Relative: 0 %
Eosinophils Absolute: 0 10*3/uL (ref 0.0–0.5)
Eosinophils Relative: 0 %
HCT: 41.3 % (ref 36.0–46.0)
Hemoglobin: 13.8 g/dL (ref 12.0–15.0)
Immature Granulocytes: 4 %
Lymphocytes Relative: 8 %
Lymphs Abs: 0.9 10*3/uL (ref 0.7–4.0)
MCH: 29.2 pg (ref 26.0–34.0)
MCHC: 33.4 g/dL (ref 30.0–36.0)
MCV: 87.5 fL (ref 80.0–100.0)
Monocytes Absolute: 0.5 10*3/uL (ref 0.1–1.0)
Monocytes Relative: 4 %
Neutro Abs: 9.6 10*3/uL — ABNORMAL HIGH (ref 1.7–7.7)
Neutrophils Relative %: 84 %
Platelets: 194 10*3/uL (ref 150–400)
RBC: 4.72 MIL/uL (ref 3.87–5.11)
RDW: 13 % (ref 11.5–15.5)
WBC: 11.3 10*3/uL — ABNORMAL HIGH (ref 4.0–10.5)
nRBC: 0 % (ref 0.0–0.2)

## 2018-08-05 LAB — COMPREHENSIVE METABOLIC PANEL
ALT: 78 U/L — ABNORMAL HIGH (ref 0–44)
AST: 34 U/L (ref 15–41)
Albumin: 3 g/dL — ABNORMAL LOW (ref 3.5–5.0)
Alkaline Phosphatase: 90 U/L (ref 38–126)
Anion gap: 8 (ref 5–15)
BUN: 18 mg/dL (ref 8–23)
CO2: 23 mmol/L (ref 22–32)
Calcium: 8.6 mg/dL — ABNORMAL LOW (ref 8.9–10.3)
Chloride: 107 mmol/L (ref 98–111)
Creatinine, Ser: 0.65 mg/dL (ref 0.44–1.00)
GFR calc Af Amer: >60 mL/min (ref >60)
GFR calc non Af Amer: >60 mL/min (ref >60)
Glucose, Bld: 169 mg/dL — ABNORMAL HIGH (ref 70–99)
Potassium: 3.9 mmol/L (ref 3.5–5.1)
Sodium: 138 mmol/L (ref 135–145)
Total Bilirubin: 0.6 mg/dL (ref 0.3–1.2)
Total Protein: 6.2 g/dL — ABNORMAL LOW (ref 6.5–8.1)

## 2018-08-05 MED ORDER — POLYETHYLENE GLYCOL 3350 17 G PO PACK: 17.0000 g | PACK | Freq: Every day | ORAL | Status: DC

## 2018-08-05 NOTE — Progress Notes (Signed)
[place] TEAM 1 - Stepdown/ICU TEAM  Beth Mueller  OYD:[id number] DOB: [birth year] DOA: 08/02/2018 PCP: Patient, No Pcp Per    Brief Narrative:  68yo w/ a hx of HTN who was discharged from [facility] 07/29/2018 on room air following tx w/ steroids, Actemra, and azithromycin. The patient returned to [facility] c/o worsening shortness of breath and confusion. He was found to be significantly hypoxic and tachyphrenic and readmitted to [facility].   Significant Events: 5/14 > 5/15 admit [place] 5/19 re-admit [place]  COVID-19 specific Treatment: Actemra 5/14 + 5/20 Steroids   Subjective: I spoke with the patient using the iPad interpreter.  At the time of my evaluation she was sitting up in a bedside chair.  She was not requiring supplemental oxygen.  Her respirations appeared comfortable.  She denied chest pain nausea vomiting or headache.  She did report some mild shortness of breath, especially worse when moving, as well as some mild generalized crampy abdominal pain.  She did state that she was having bowel movements.  She reports that she feels somewhat weak in general.  She also reported only a modest appetite.  Assessment & Plan:  Acute hypoxic respiratory failure - COVID-19 PNA Respiratory status has greatly improved with patient on room air at time of my evaluation -follow without further interventions at this time  Staph capitis and pasteuri Patient has had 2 different coag negative staph isolated from blood cultures obtained on 5/14 -present there are no clinical symptoms to suggest a true bacteremia -I suspect these are simply contaminants -follow clinically without antibiotics at this time  HTN Blood pressure currently stable  Abdominal pain - constipation  Patient reports that she has now had a bowel movement -I have encouraged her to eat -monitor clinically  DVT prophylaxis: lovenox  Code Status: FULL CODE Family Communication:  Disposition Plan: change to med/surg -  ambulate - PT/OT - follow GI sx   Consultants:  none  Antimicrobials:  none  Objective: Blood pressure 134/72, pulse 74, temperature 98.1 F (36.7 C), temperature source Oral, resp. rate (!) 23, SpO2 92 %.  Intake/Output Summary (Last 24 hours) at 08/05/2018 0952 Last data filed at 08/05/2018 6720 Gross per 24 hour  Intake 989.05 ml  Output 1050 ml  Net -60.95 ml   There were no vitals filed for this visit.  Examination: General: No acute respiratory distress Lungs: Clear to auscultation bilaterally without wheezes or crackles Cardiovascular: Regular rate and rhythm without murmur gallop or rub normal S1 and S2 Abdomen: Nontender, nondistended, soft, bowel sounds positive, no rebound, no ascites, no appreciable mass Extremities: No significant cyanosis, clubbing, or edema bilateral lower extremities  CBC: Recent Labs  Lab 08/03/18 0022 08/04/18 0538 08/05/18 0431  WBC 5.3 7.2 11.3*  NEUTROABS 3.6 5.9 9.6*  HGB 14.1 14.2 13.8  HCT 42.3 41.6 41.3  MCV 89.2 87.9 87.5  PLT 154 186 947   Basic Metabolic Panel: Recent Labs  Lab 08/03/18 0022 08/04/18 0538 08/05/18 0431  NA 137 135 138  K 3.9 4.0 3.9  CL 105 106 107  CO2 23 21* 23  GLUCOSE 102* 155* 169*  BUN 17 18 18   CREATININE 0.71 0.64 0.65  CALCIUM 8.4* 8.4* 8.6*   GFR: Estimated Creatinine Clearance: 58.3 mL/min (by C-G formula based on SCr of 0.65 mg/dL).  Liver Function Tests: Recent Labs  Lab 08/03/18 0022 08/04/18 0538 08/05/18 0431  AST 73* 42* 34  ALT 99* 83* 78*  ALKPHOS 94 91 90  BILITOT 0.8 0.8 0.6  PROT 6.2* 6.3* 6.2*  ALBUMIN 3.1* 3.1* 3.0*     Recent Results (from the past 240 hour(s))  Culture, blood (Routine X 2) w Reflex to ID Panel     Status: Abnormal   Collection Time: 07/28/18  3:14 PM  Result Value Ref Range Status   Specimen Description BLOOD LEFT ANTECUBITAL  Final   Special Requests   Final    BOTTLES DRAWN AEROBIC ONLY Blood Culture adequate volume   Culture  Setup  Time   Final    AEROBIC BOTTLE ONLY GRAM POSITIVE COCCI CRITICAL VALUE NOTED.  VALUE IS CONSISTENT WITH PREVIOUSLY REPORTED AND CALLED VALUE.    Culture (A)  Final    STAPHYLOCOCCUS CAPITIS THE SIGNIFICANCE OF ISOLATING THIS ORGANISM FROM A SINGLE SET OF BLOOD CULTURES WHEN MULTIPLE SETS ARE DRAWN IS UNCERTAIN. PLEASE NOTIFY THE MICROBIOLOGY DEPARTMENT WITHIN ONE WEEK IF SPECIATION AND SENSITIVITIES ARE REQUIRED. Performed at [facility], [place] [address] [place], [place] [address]    Report Status 07/31/2018 FINAL  Final  Culture, blood (Routine X 2) w Reflex to ID Panel     Status: None   Collection Time: 07/28/18  3:17 PM  Result Value Ref Range Status   Specimen Description BLOOD LEFT HAND  Final   Special Requests   Final    BOTTLES DRAWN AEROBIC ONLY Blood Culture adequate volume   Culture  Setup Time   Final    GRAM POSITIVE COCCI AEROBIC BOTTLE ONLY Organism ID to follow CRITICAL RESULT CALLED TO, READ BACK BY AND VERIFIED WITH: PHARMD B MANCHERIL [id number] MLM    Culture   Final    STAPHYLOCOCCUS PASTEURI THE SIGNIFICANCE OF ISOLATING THIS ORGANISM FROM A SINGLE SET OF BLOOD CULTURES WHEN MULTIPLE SETS ARE DRAWN IS UNCERTAIN. PLEASE NOTIFY THE MICROBIOLOGY DEPARTMENT WITHIN ONE WEEK IF SPECIATION AND SENSITIVITIES ARE REQUIRED. Performed at [facility], [place] [address], [place], [place] [address]    Report Status 07/31/2018 FINAL  Final  Blood Culture ID Panel (Reflexed)     Status: Abnormal   Collection Time: 07/28/18  3:17 PM  Result Value Ref Range Status   Enterococcus species NOT DETECTED NOT DETECTED Final   Listeria monocytogenes NOT DETECTED NOT DETECTED Final   Staphylococcus species DETECTED (A) NOT DETECTED Final    Comment: Methicillin (oxacillin) susceptible coagulase negative staphylococcus. Possible blood culture contaminant (unless isolated from more than one blood culture draw or clinical case suggests pathogenicity). No antibiotic treatment  is indicated for blood  culture contaminants. CRITICAL RESULT CALLED TO, READ BACK BY AND VERIFIED WITH: PHARMD B MANCHERIL [id number] MLM    Staphylococcus aureus (BCID) NOT DETECTED NOT DETECTED Final   Methicillin resistance NOT DETECTED NOT DETECTED Final   Streptococcus species NOT DETECTED NOT DETECTED Final   Streptococcus agalactiae NOT DETECTED NOT DETECTED Final   Streptococcus pneumoniae NOT DETECTED NOT DETECTED Final   Streptococcus pyogenes NOT DETECTED NOT DETECTED Final   Acinetobacter baumannii NOT DETECTED NOT DETECTED Final   Enterobacteriaceae species NOT DETECTED NOT DETECTED Final   Enterobacter cloacae complex NOT DETECTED NOT DETECTED Final   Escherichia coli NOT DETECTED NOT DETECTED Final   Klebsiella oxytoca NOT DETECTED NOT DETECTED Final   Klebsiella pneumoniae NOT DETECTED NOT DETECTED Final   Proteus species NOT DETECTED NOT DETECTED Final   Serratia marcescens NOT DETECTED NOT DETECTED Final   Haemophilus influenzae NOT DETECTED NOT DETECTED Final   Neisseria meningitidis NOT DETECTED NOT  DETECTED Final   Pseudomonas aeruginosa NOT DETECTED NOT DETECTED Final   Candida albicans NOT DETECTED NOT DETECTED Final   Candida glabrata NOT DETECTED NOT DETECTED Final   Candida krusei NOT DETECTED NOT DETECTED Final   Candida parapsilosis NOT DETECTED NOT DETECTED Final   Candida tropicalis NOT DETECTED NOT DETECTED Final    Comment: Performed at [facility], [place] [address], [place], [place] [address]     Scheduled Meds:  aspirin EC  81 mg Oral Daily   enoxaparin (LOVENOX) injection  40 mg Subcutaneous Q12H   sodium chloride flush  3 mL Intravenous Q12H   sorbitol  30 mL Oral Once   vitamin C  500 mg Oral Daily   zinc sulfate  220 mg Oral Daily     LOS: 3 days   Cherene Altes, MD [facility] Office  [phone] Pager - Text Page per Shea Evans  If 7PM-7AM, please contact night-coverage per Amion 08/05/2018, 9:52 AM

## 2018-08-05 NOTE — Progress Notes (Signed)
[place] and AHF Team  ReDS Research Project   Patient #: [id number]  ReDS Measurement  Right: 32 %  Left: 28 %

## 2018-08-06 DIAGNOSIS — R1032 Left lower quadrant pain: Secondary | ICD-10-CM

## 2018-08-06 LAB — CBC WITH DIFFERENTIAL/PLATELET
Abs Immature Granulocytes: 0.44 10*3/uL — ABNORMAL HIGH (ref 0.00–0.07)
Basophils Absolute: 0 10*3/uL (ref 0.0–0.1)
Basophils Relative: 1 %
Eosinophils Absolute: 0 10*3/uL (ref 0.0–0.5)
Eosinophils Relative: 1 %
HCT: 41.8 % (ref 36.0–46.0)
Hemoglobin: 13.7 g/dL (ref 12.0–15.0)
Immature Granulocytes: 7 %
Lymphocytes Relative: 22 %
Lymphs Abs: 1.4 10*3/uL (ref 0.7–4.0)
MCH: 29.1 pg (ref 26.0–34.0)
MCHC: 32.8 g/dL (ref 30.0–36.0)
MCV: 88.9 fL (ref 80.0–100.0)
Monocytes Absolute: 0.6 10*3/uL (ref 0.1–1.0)
Monocytes Relative: 10 %
Neutro Abs: 3.6 10*3/uL (ref 1.7–7.7)
Neutrophils Relative %: 59 %
Platelets: 182 10*3/uL (ref 150–400)
RBC: 4.7 MIL/uL (ref 3.87–5.11)
RDW: 13.3 % (ref 11.5–15.5)
WBC: 6.1 10*3/uL (ref 4.0–10.5)
nRBC: 0 % (ref 0.0–0.2)

## 2018-08-06 LAB — COMPREHENSIVE METABOLIC PANEL
ALT: 85 U/L — ABNORMAL HIGH (ref 0–44)
AST: 52 U/L — ABNORMAL HIGH (ref 15–41)
Albumin: 3 g/dL — ABNORMAL LOW (ref 3.5–5.0)
Alkaline Phosphatase: 80 U/L (ref 38–126)
Anion gap: 8 (ref 5–15)
BUN: 18 mg/dL (ref 8–23)
CO2: 24 mmol/L (ref 22–32)
Calcium: 8.3 mg/dL — ABNORMAL LOW (ref 8.9–10.3)
Chloride: 106 mmol/L (ref 98–111)
Creatinine, Ser: 0.7 mg/dL (ref 0.44–1.00)
GFR calc Af Amer: >60 mL/min (ref >60)
GFR calc non Af Amer: >60 mL/min (ref >60)
Glucose, Bld: 94 mg/dL (ref 70–99)
Potassium: 3.8 mmol/L (ref 3.5–5.1)
Sodium: 138 mmol/L (ref 135–145)
Total Bilirubin: 0.5 mg/dL (ref 0.3–1.2)
Total Protein: 5.7 g/dL — ABNORMAL LOW (ref 6.5–8.1)

## 2018-08-06 LAB — LACTATE DEHYDROGENASE: LDH: 189 U/L (ref 98–192)

## 2018-08-06 LAB — MAGNESIUM: Magnesium: 2.1 mg/dL (ref 1.7–2.4)

## 2018-08-06 MED ORDER — ASPIRIN 81 MG PO TBEC: 81.0000 mg | DELAYED_RELEASE_TABLET | Freq: Every day | ORAL | Status: AC

## 2018-08-06 NOTE — Progress Notes (Signed)
Patient's IV removed.  Site WNL.  AVS reviewed with patient's daughter using interpreter machine.  Verbalized understanding of discharge instructions, physician follow-up, medications.  Patient transported by RN via wheelchair to main entrance at discharge.  Patient stable at discharge.  Belongings with patient at discharge.

## 2018-08-06 NOTE — Discharge Summary (Signed)
DISCHARGE SUMMARY  Beth Mueller  MR#: [id number]  DOB:22-Mar-1950  Date of Admission: 08/02/2018 Date of Discharge: 08/06/2018  Attending Physician:Abdiel Blackerby Hennie Duos, MD  Patient's JME:[id number], No Pcp Per  Consults: none  Disposition: D/C home   Follow-up Appts: Follow-up [place] Follow up on 08/12/2018.   Why:  Hospital follow-up at 9:30am. This will be a telephone visit. Ofiice will call at time of appointment. Contact information: [place] [address] [phone]          Tests Needing Follow-up: -determine if lopressor should be resumed   Discharge Diagnoses: Acute hypoxic respiratory failure COVID-19 PNA Staph capitis and pasteuri in blood cultures  HTN Abdominal pain - constipation   Initial presentation: 68yo w/ a hx of HTN who was discharged from [facility] 07/29/2018 on room air following tx w/ steroids, Actemra, and azithromycin. The patient returned to [facility] c/o worsening shortness of breath and confusion. He was found to be significantly hypoxic and tachyphrenic and readmitted to [facility].   Hospital Course:  Acute hypoxic respiratory failure - COVID-19 PNA Respiratory status has greatly improved with patient on room air at time of d/c - denies any respiratory complaints - clinically stable   Staph capitis and pasteuri in blood cx  Patient has had 2 different coag negative Staph isolated from blood cultures obtained on 5/14 - there were no clinical symptoms to suggest a true bacteremia -I suspect these are simply contaminants - no antibiotics administered during this admit   HTN Blood pressure currently stable w/o home BB - cont to hold at time of d/c - for outpt f/u of BP to determine if BB should be resumed   Abdominal pain - constipation  abdom pain essentially resolved after pt had a bowel movement - no abdom distention - abdom exam normal - no  sx to suggest bowel ischemia - tolerating regular diet at time of d/c    Allergies as of 08/06/2018   Not on File     Medication List    STOP taking these medications   azithromycin 500 MG tablet Commonly known as:  Zithromax   metoprolol tartrate 25 MG tablet Commonly known as:  LOPRESSOR   pantoprazole 40 MG tablet Commonly known as:  Protonix   predniSONE 10 MG tablet Commonly known as:  DELTASONE     TAKE these medications   acetaminophen 500 MG tablet Commonly known as:  TYLENOL Take 500 mg by mouth every 6 (six) hours as needed for mild pain, fever or headache.   aspirin 81 MG EC tablet Take 1 tablet (81 mg total) by mouth daily. What changed:    medication strength  how much to take  when to take this   chlorpheniramine-HYDROcodone 10-8 MG/5ML Suer Commonly known as:  TUSSIONEX Take 5 mLs by mouth every 12 (twelve) hours as needed for cough (refractory).   guaiFENesin 600 MG 12 hr tablet Commonly known as:  Mucinex Take 1 tablet (600 mg total) by mouth 2 (two) times daily.       Day of Discharge BP 121/82 (BP Location: Right Arm)   Pulse 86   Temp 97.7 F (36.5 C) (Oral)   Resp 16   SpO2 96%   Physical Exam: General: No acute respiratory distress Lungs: Clear to auscultation bilaterally without wheezes or crackles Cardiovascular: Regular rate and rhythm without murmur gallop or rub normal S1 and S2 Abdomen: Nontender, nondistended, soft, bowel sounds  positive, no rebound, no ascites, no appreciable mass Extremities: No significant cyanosis, clubbing, or edema bilateral lower extremities  Basic Metabolic Panel: Recent Labs  Lab 08/03/18 0022 08/04/18 0538 08/05/18 0431 08/06/18 0400  NA 137 135 138 138  K 3.9 4.0 3.9 3.8  CL 105 106 107 106  CO2 23 21* 23 24  GLUCOSE 102* 155* 169* 94  BUN 17 18 18 18   CREATININE 0.71 0.64 0.65 0.70  CALCIUM 8.4* 8.4* 8.6* 8.3*  MG  --   --   --  2.1    Liver Function Tests: Recent Labs  Lab  08/03/18 0022 08/04/18 0538 08/05/18 0431 08/06/18 0400  AST 73* 42* 34 52*  ALT 99* 83* 78* 85*  ALKPHOS 94 91 90 80  BILITOT 0.8 0.8 0.6 0.5  PROT 6.2* 6.3* 6.2* 5.7*  ALBUMIN 3.1* 3.1* 3.0* 3.0*    CBC: Recent Labs  Lab 08/03/18 0022 08/04/18 0538 08/05/18 0431 08/06/18 0400  WBC 5.3 7.2 11.3* 6.1  NEUTROABS 3.6 5.9 9.6* 3.6  HGB 14.1 14.2 13.8 13.7  HCT 42.3 41.6 41.3 41.8  MCV 89.2 87.9 87.5 88.9  PLT 154 186 194 182    Recent Results (from the past 240 hour(s))  Culture, blood (Routine X 2) w Reflex to ID Panel     Status: Abnormal   Collection Time: 07/28/18  3:14 PM  Result Value Ref Range Status   Specimen Description BLOOD LEFT ANTECUBITAL  Final   Special Requests   Final    BOTTLES DRAWN AEROBIC ONLY Blood Culture adequate volume   Culture  Setup Time   Final    AEROBIC BOTTLE ONLY GRAM POSITIVE COCCI CRITICAL VALUE NOTED.  VALUE IS CONSISTENT WITH PREVIOUSLY REPORTED AND CALLED VALUE.    Culture (A)  Final    STAPHYLOCOCCUS CAPITIS THE SIGNIFICANCE OF ISOLATING THIS ORGANISM FROM A SINGLE SET OF BLOOD CULTURES WHEN MULTIPLE SETS ARE DRAWN IS UNCERTAIN. PLEASE NOTIFY THE MICROBIOLOGY DEPARTMENT WITHIN ONE WEEK IF SPECIATION AND SENSITIVITIES ARE REQUIRED. Performed at [facility], [place] [address], [place], [place] [address]    Report Status 07/31/2018 FINAL  Final  Culture, blood (Routine X 2) w Reflex to ID Panel     Status: None   Collection Time: 07/28/18  3:17 PM  Result Value Ref Range Status   Specimen Description BLOOD LEFT HAND  Final   Special Requests   Final    BOTTLES DRAWN AEROBIC ONLY Blood Culture adequate volume   Culture  Setup Time   Final    GRAM POSITIVE COCCI AEROBIC BOTTLE ONLY Organism ID to follow CRITICAL RESULT CALLED TO, READ BACK BY AND VERIFIED WITH: PHARMD B MANCHERIL [id number] MLM    Culture   Final    STAPHYLOCOCCUS PASTEURI THE SIGNIFICANCE OF ISOLATING THIS ORGANISM FROM A SINGLE SET OF BLOOD CULTURES  WHEN MULTIPLE SETS ARE DRAWN IS UNCERTAIN. PLEASE NOTIFY THE MICROBIOLOGY DEPARTMENT WITHIN ONE WEEK IF SPECIATION AND SENSITIVITIES ARE REQUIRED. Performed at [facility], [place] [address], [place], [place] [address]    Report Status 07/31/2018 FINAL  Final  Blood Culture ID Panel (Reflexed)     Status: Abnormal   Collection Time: 07/28/18  3:17 PM  Result Value Ref Range Status   Enterococcus species NOT DETECTED NOT DETECTED Final   Listeria monocytogenes NOT DETECTED NOT DETECTED Final   Staphylococcus species DETECTED (A) NOT DETECTED Final    Comment: Methicillin (oxacillin) susceptible coagulase negative staphylococcus. Possible blood culture contaminant (  unless isolated from more than one blood culture draw or clinical case suggests pathogenicity). No antibiotic treatment is indicated for blood  culture contaminants. CRITICAL RESULT CALLED TO, READ BACK BY AND VERIFIED WITH: PHARMD B MANCHERIL [phone] MLM    Staphylococcus aureus (BCID) NOT DETECTED NOT DETECTED Final   Methicillin resistance NOT DETECTED NOT DETECTED Final   Streptococcus species NOT DETECTED NOT DETECTED Final   Streptococcus agalactiae NOT DETECTED NOT DETECTED Final   Streptococcus pneumoniae NOT DETECTED NOT DETECTED Final   Streptococcus pyogenes NOT DETECTED NOT DETECTED Final   Acinetobacter baumannii NOT DETECTED NOT DETECTED Final   Enterobacteriaceae species NOT DETECTED NOT DETECTED Final   Enterobacter cloacae complex NOT DETECTED NOT DETECTED Final   Escherichia coli NOT DETECTED NOT DETECTED Final   Klebsiella oxytoca NOT DETECTED NOT DETECTED Final   Klebsiella pneumoniae NOT DETECTED NOT DETECTED Final   Proteus species NOT DETECTED NOT DETECTED Final   Serratia marcescens NOT DETECTED NOT DETECTED Final   Haemophilus influenzae NOT DETECTED NOT DETECTED Final   Neisseria meningitidis NOT DETECTED NOT DETECTED Final   Pseudomonas aeruginosa NOT DETECTED NOT DETECTED Final   Candida  albicans NOT DETECTED NOT DETECTED Final   Candida glabrata NOT DETECTED NOT DETECTED Final   Candida krusei NOT DETECTED NOT DETECTED Final   Candida parapsilosis NOT DETECTED NOT DETECTED Final   Candida tropicalis NOT DETECTED NOT DETECTED Final    Comment: Performed at [facility], [place]. [address], [place], [place] [address]     Time spent in discharge (includes decision making & examination of pt): 35 minutes  08/06/2018, 10:31 AM   Cherene Altes, MD [facility] Office  [phone]

## 2018-08-06 NOTE — Progress Notes (Signed)
[place] and AHF Team  ReDS Research Project   Patient #: [id number]  ReDS Measurement  Right: 27 %  Left: 32 %

## 2018-08-06 NOTE — Progress Notes (Signed)
Called patient's granddaughter,  Linus Orn and gave update on patient. Told her that patient would be discharged today. She requested that we call her with discharge instructions as she will have a better understanding than patient will have. She stated that no one will be available to pick patient up until 1600.

## 2018-08-06 NOTE — Discharge Instructions (Signed)
Infection Prevention Recommendations for Individuals Confirmed to have, or Being Evaluated for, 2019 Novel Coronavirus (COVID-19) Infection Who Receive Care at Home  Individuals who are confirmed to have, or are being evaluated for, COVID-19 should follow the prevention steps below until a healthcare provider or local or state health department says they can return to normal activities.  Stay home except to get medical care You should restrict activities outside your home, except for getting medical care. Do not go to work, school, or public areas, and do not use public transportation or taxis.  Call ahead before visiting your doctor Before your medical appointment, call the healthcare provider and tell them that you have, or are being evaluated for, COVID-19 infection. This will help the healthcare providers office take steps to keep other people from getting infected. Ask your healthcare provider to call the local or state health department.  Monitor your symptoms Seek prompt medical attention if your illness is worsening (e.g., difficulty breathing). Before going to your medical appointment, call the healthcare provider and tell them that you have, or are being evaluated for, COVID-19 infection. Ask your healthcare provider to call the local or state health department.  Wear a facemask You should wear a facemask that covers your nose and mouth when you are in the same room with other people and when you visit a healthcare provider. People who live with or visit you should also wear a facemask while they are in the same room with you.  Separate yourself from other people in your home As much as possible, you should stay in a different room from other people in your home. Also, you should use a separate bathroom, if available.  Avoid sharing household items You should not share dishes, drinking glasses, cups, eating utensils, towels, bedding, or other items with other people  in your home. After using these items, you should wash them thoroughly with soap and water.  Cover your coughs and sneezes Cover your mouth and nose with a tissue when you cough or sneeze, or you can cough or sneeze into your sleeve. Throw used tissues in a lined trash can, and immediately wash your hands with soap and water for at least 20 seconds or use an alcohol-based hand rub.  Wash your [facility] your hands often and thoroughly with soap and water for at least 20 seconds. You can use an alcohol-based hand sanitizer if soap and water are not available and if your hands are not visibly dirty. Avoid touching your eyes, nose, and mouth with unwashed hands.   Prevention Steps for Caregivers and Household Members of Individuals Confirmed to have, or Being Evaluated for, COVID-19 Infection Being Cared for in the Home  If you live with, or provide care at home for, a person confirmed to have, or being evaluated for, COVID-19 infection please follow these guidelines to prevent infection:  Follow healthcare providers instructions Make sure that you understand and can help the patient follow any healthcare provider instructions for all care.  Provide for the patients basic needs You should help the patient with basic needs in the home and provide support for getting groceries, prescriptions, and other personal needs.  Monitor the patients symptoms If they are getting sicker, call his or her medical provider and tell them that the patient has, or is being evaluated for, COVID-19 infection. This will help the healthcare providers office take steps to keep other people from getting infected. Ask the healthcare provider to call the local or  state health department.  Limit the number of people who have contact with the patient  If possible, have only one caregiver for the patient.  Other household members should stay in another home or place of residence. If this is not possible, they  should stay  in another room, or be separated from the patient as much as possible. Use a separate bathroom, if available.  Restrict visitors who do not have an essential need to be in the home.  Keep older adults, very young children, and other sick people away from the patient Keep older adults, very young children, and those who have compromised immune systems or chronic health conditions away from the patient. This includes people with chronic heart, lung, or kidney conditions, diabetes, and cancer.  Ensure good ventilation Make sure that shared spaces in the home have good air flow, such as from an air conditioner or an opened window, weather permitting.  Wash your hands often  Wash your hands often and thoroughly with soap and water for at least 20 seconds. You can use an alcohol based hand sanitizer if soap and water are not available and if your hands are not visibly dirty.  Avoid touching your eyes, nose, and mouth with unwashed hands.  Use disposable paper towels to dry your hands. If not available, use dedicated cloth towels and replace them when they become wet.  Wear a facemask and gloves  Wear a disposable facemask at all times in the room and gloves when you touch or have contact with the patients blood, body fluids, and/or secretions or excretions, such as sweat, saliva, sputum, nasal mucus, vomit, urine, or feces.  Ensure the mask fits over your nose and mouth tightly, and do not touch it during use.  Throw out disposable facemasks and gloves after using them. Do not reuse.  Wash your hands immediately after removing your facemask and gloves.  If your personal clothing becomes contaminated, carefully remove clothing and launder. Wash your hands after handling contaminated clothing.  Place all used disposable facemasks, gloves, and other waste in a lined container before disposing them with other household waste.  Remove gloves and wash your hands immediately after  handling these items.  Do not share dishes, glasses, or other household items with the patient  Avoid sharing household items. You should not share dishes, drinking glasses, cups, eating utensils, towels, bedding, or other items with a patient who is confirmed to have, or being evaluated for, COVID-19 infection.  After the person uses these items, you should wash them thoroughly with soap and water.  Wash laundry thoroughly  Immediately remove and wash clothes or bedding that have blood, body fluids, and/or secretions or excretions, such as sweat, saliva, sputum, nasal mucus, vomit, urine, or feces, on them.  Wear gloves when handling laundry from the patient.  Read and follow directions on labels of laundry or clothing items and detergent. In general, wash and dry with the warmest temperatures recommended on the label.  Clean all areas the individual has used often  Clean all touchable surfaces, such as counters, tabletops, doorknobs, bathroom fixtures, toilets, phones, keyboards, tablets, and bedside tables, every day. Also, clean any surfaces that may have blood, body fluids, and/or secretions or excretions on them.  Wear gloves when cleaning surfaces the patient has come in contact with.  Use a diluted bleach solution (e.g., dilute bleach with 1 part bleach and 10 parts water) or a household disinfectant with a label that says EPA-registered for coronaviruses. To  make a bleach solution at home, add 1 tablespoon of bleach to 1 quart (4 cups) of water. For a larger supply, add  cup of bleach to 1 gallon (16 cups) of water.  Read labels of cleaning products and follow recommendations provided on product labels. Labels contain instructions for safe and effective use of the cleaning product including precautions you should take when applying the product, such as wearing gloves or eye protection and making sure you have good ventilation during use of the product.  Remove gloves and wash  hands immediately after cleaning.  Monitor yourself for signs and symptoms of illness Caregivers and household members are considered close contacts, should monitor their health, and will be asked to limit movement outside of the home to the extent possible. Follow the monitoring steps for close contacts listed on the symptom monitoring form.   ? If you have additional questions, contact your local health department or call the epidemiologist on call at [phone] (available 24/7). ? This guidance is subject to change. For the most up-to-date guidance from [facility], please refer to their website: [email or web address]

## 2018-08-12 ENCOUNTER — Ambulatory Visit: Payer: Self-pay | Admitting: Family Medicine

## 2018-08-12 ENCOUNTER — Telehealth (INDEPENDENT_AMBULATORY_CARE_PROVIDER_SITE_OTHER): Payer: Self-pay | Admitting: General Practice

## 2018-08-12 NOTE — Telephone Encounter (Signed)
Was informed I have the wrong ph# for pt. Removed [phone] from chart. Pt was scheduled for OV with [facility] today - also were told they had wrong ph#.

## 2018-12-29 DIAGNOSIS — Z01818 Encounter for other preprocedural examination: Secondary | ICD-10-CM

## 2019-04-18 DIAGNOSIS — R42 Dizziness and giddiness: Secondary | ICD-10-CM

## 2019-04-18 DIAGNOSIS — R911 Solitary pulmonary nodule: Secondary | ICD-10-CM

## 2019-04-19 DIAGNOSIS — I6389 Other cerebral infarction: Secondary | ICD-10-CM

## 2020-02-05 DIAGNOSIS — I6389 Other cerebral infarction: Secondary | ICD-10-CM

## 2020-02-29 ENCOUNTER — Other Ambulatory Visit: Payer: Self-pay | Admitting: *Deleted

## 2020-02-29 DIAGNOSIS — Z1231 Encounter for screening mammogram for malignant neoplasm of breast: Secondary | ICD-10-CM

## 2020-03-04 ENCOUNTER — Other Ambulatory Visit: Payer: Self-pay | Admitting: Family Medicine

## 2020-03-04 ENCOUNTER — Other Ambulatory Visit: Payer: Self-pay

## 2020-03-04 ENCOUNTER — Ambulatory Visit
Admission: RE | Admit: 2020-03-04 | Discharge: 2020-03-04 | Disposition: A | Discharge status: Home / Self Care | Payer: No Typology Code available for payment source | Source: Ambulatory Visit | Attending: Family Medicine | Admitting: Family Medicine

## 2020-03-04 DIAGNOSIS — Z1231 Encounter for screening mammogram for malignant neoplasm of breast: Secondary | ICD-10-CM

## 2020-04-12 IMAGING — DX PORTABLE CHEST - 1 VIEW
1 series · 1 of 1 positions shown · non-contrast
Comparison: None.

CLINICAL DATA: Shortness of breath.

EXAM:
PORTABLE CHEST 1 VIEW

[chest]
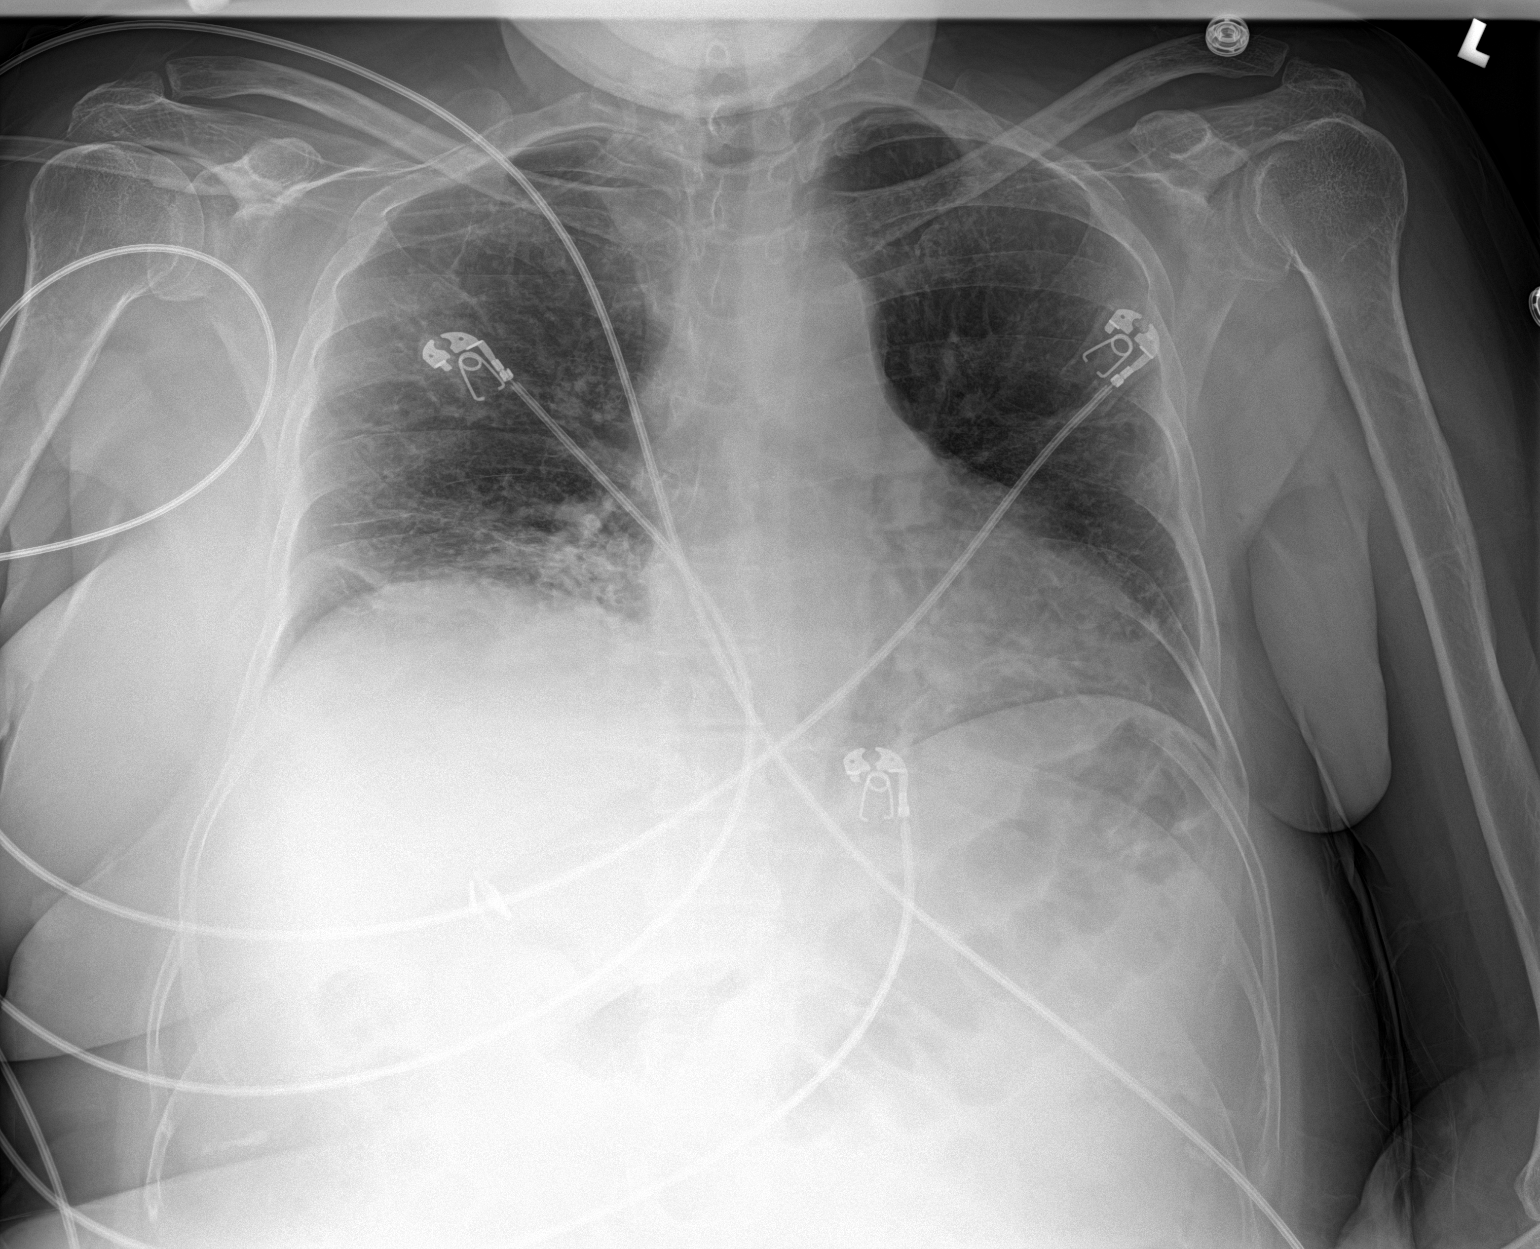

[1 of 1 positions shown; findings below may reference images not displayed]

FINDINGS: Stable cardiomediastinal silhouette. No pneumothorax is noted. Mild
bibasilar subsegmental atelectasis or infiltrates are noted. No
pleural effusion is noted. Bony thorax is unremarkable.
IMPRESSION: Mild bibasilar subsegmental atelectasis or infiltrates are noted.

## 2020-04-19 IMAGING — DX PORTABLE CHEST - 1 VIEW
1 series · 1 of 1 positions shown · non-contrast
Comparison: 07/28/2018

CLINICAL DATA: Positive [id number]

EXAM:
PORTABLE CHEST 1 VIEW

[chest]
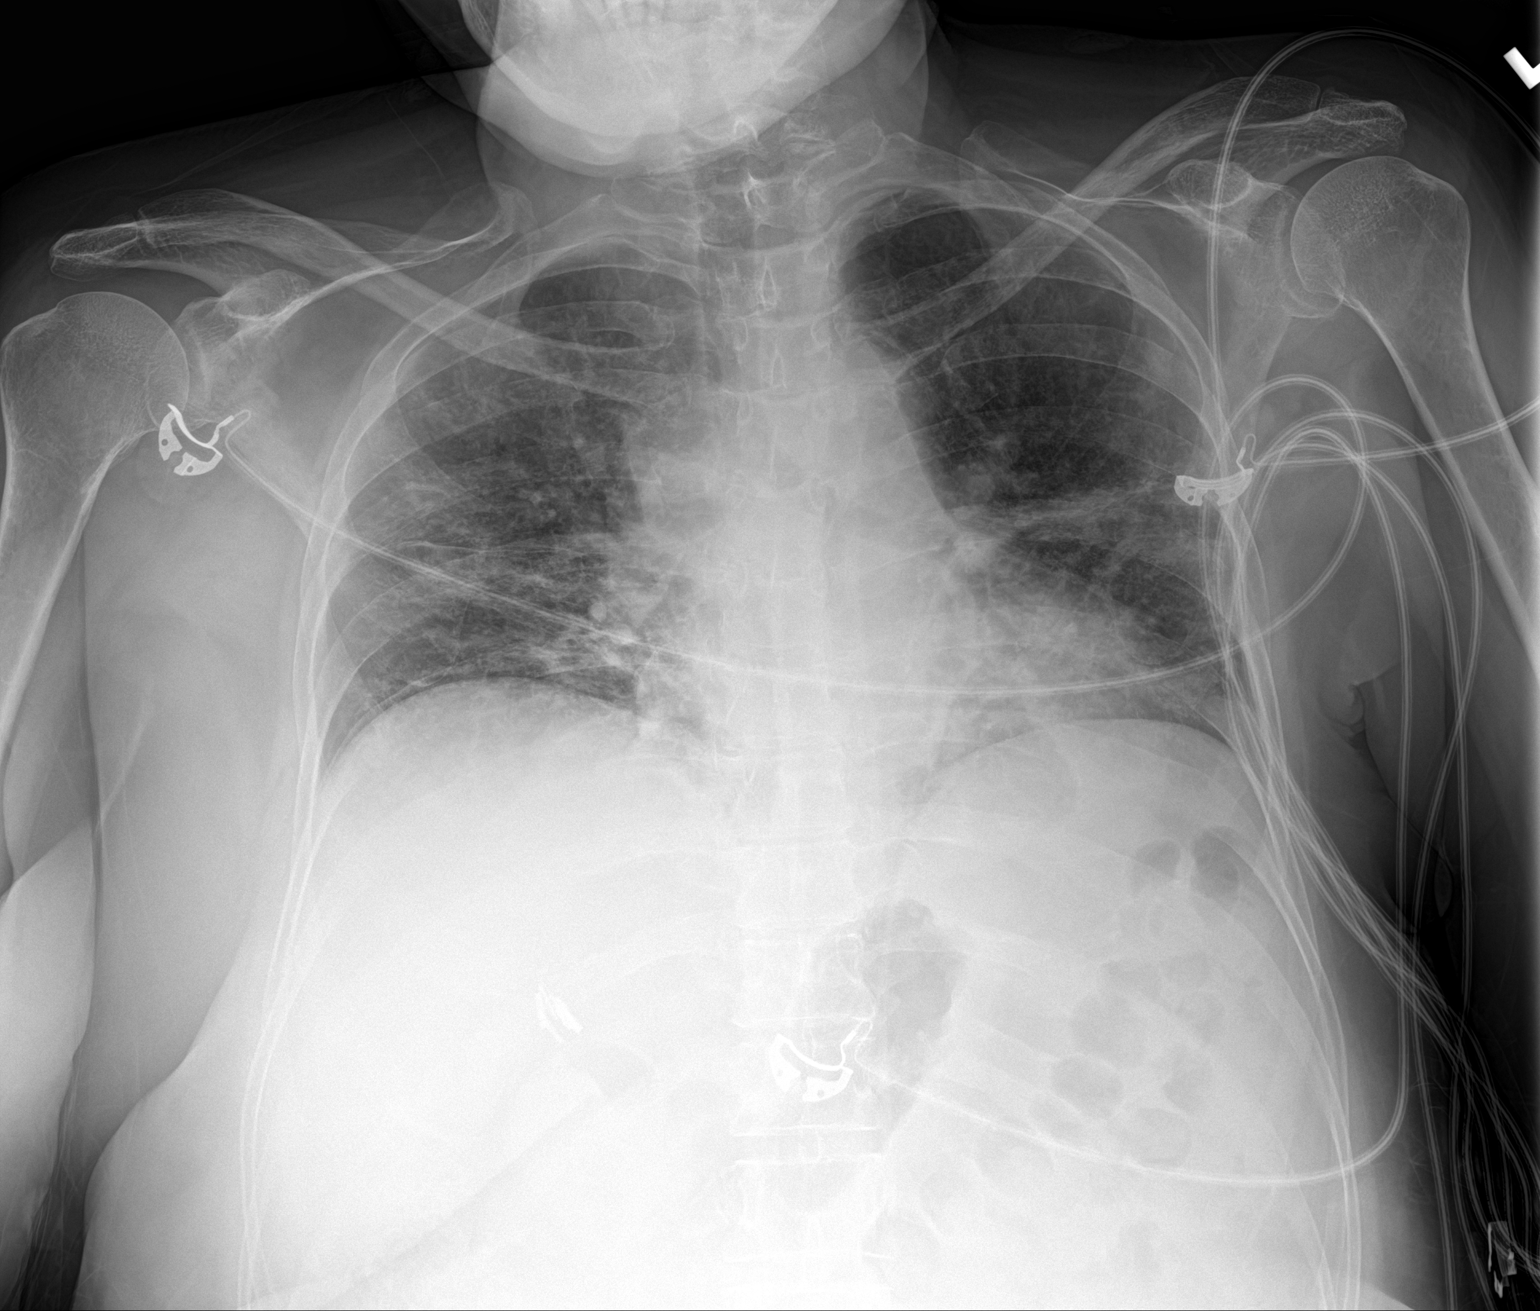

[1 of 1 positions shown; findings below may reference images not displayed]

FINDINGS: Cardiac shadow is stable. Mild patchy atelectatic changes are noted
bilaterally slightly different than that seen on the prior exam
consistent with some waxing and waning change. No sizable effusion
is noted. No bony abnormality is seen.
IMPRESSION: Bibasilar atelectasis although slightly different than that seen on
the prior exam consistent with some interval improvement and
recurrence.

## 2020-08-15 DIAGNOSIS — R001 Bradycardia, unspecified: Secondary | ICD-10-CM

## 2022-08-24 ENCOUNTER — Telehealth: Payer: Self-pay

## 2022-08-24 ENCOUNTER — Other Ambulatory Visit: Payer: Self-pay | Admitting: Family Medicine

## 2022-08-24 DIAGNOSIS — Z1231 Encounter for screening mammogram for malignant neoplasm of breast: Secondary | ICD-10-CM

## 2022-08-24 NOTE — Telephone Encounter (Signed)
Telephoned patient using interpreter#[id number]. Patient approved for mammogram scholarship and her daughter will call back and schedule her appointment.

## 2022-10-21 ENCOUNTER — Other Ambulatory Visit: Payer: Self-pay | Admitting: Family Medicine

## 2022-10-21 ENCOUNTER — Ambulatory Visit
Admission: RE | Admit: 2022-10-21 | Discharge: 2022-10-21 | Disposition: A | Discharge status: Home / Self Care | Payer: No Typology Code available for payment source | Source: Ambulatory Visit | Attending: Family Medicine | Admitting: Family Medicine

## 2022-10-21 DIAGNOSIS — Z1231 Encounter for screening mammogram for malignant neoplasm of breast: Secondary | ICD-10-CM

## 2022-10-27 ENCOUNTER — Other Ambulatory Visit: Payer: Self-pay | Admitting: Family Medicine

## 2022-10-27 DIAGNOSIS — R928 Other abnormal and inconclusive findings on diagnostic imaging of breast: Secondary | ICD-10-CM

## 2022-11-09 ENCOUNTER — Other Ambulatory Visit: Payer: Self-pay

## 2022-11-09 ENCOUNTER — Other Ambulatory Visit: Payer: No Typology Code available for payment source

## 2022-11-09 DIAGNOSIS — R928 Other abnormal and inconclusive findings on diagnostic imaging of breast: Secondary | ICD-10-CM

## 2022-12-10 ENCOUNTER — Ambulatory Visit (HOSPITAL_COMMUNITY): Admission: RE | Admit: 2022-12-10 | Payer: Self-pay | Source: Ambulatory Visit

## 2022-12-10 ENCOUNTER — Other Ambulatory Visit: Payer: Self-pay | Admitting: Family Medicine

## 2022-12-10 ENCOUNTER — Ambulatory Visit
Admission: RE | Admit: 2022-12-10 | Discharge: 2022-12-10 | Disposition: A | Discharge status: Home / Self Care | Payer: No Typology Code available for payment source | Source: Ambulatory Visit | Attending: Obstetrics and Gynecology | Admitting: Obstetrics and Gynecology

## 2022-12-10 ENCOUNTER — Ambulatory Visit
Admission: RE | Admit: 2022-12-10 | Discharge: 2022-12-10 | Disposition: A | Discharge status: Home / Self Care | Payer: No Typology Code available for payment source | Source: Ambulatory Visit | Attending: Family Medicine | Admitting: Family Medicine

## 2022-12-10 ENCOUNTER — Ambulatory Visit: Payer: No Typology Code available for payment source

## 2022-12-10 ENCOUNTER — Ambulatory Visit: Payer: Self-pay | Admitting: Hematology and Oncology

## 2022-12-10 VITALS — BP 149/103 | Wt 162.7 lb

## 2022-12-10 DIAGNOSIS — R928 Other abnormal and inconclusive findings on diagnostic imaging of breast: Secondary | ICD-10-CM

## 2022-12-10 NOTE — Patient Instructions (Signed)
Taught Rae Lips about self breast awareness and gave educational materials to take home. Patient did not need a Pap smear today due to hysterectomy. Referred patient to the [facility] for diagnostic mammogram. Appointment scheduled for 12/10/22. Patient aware of appointment and will be there. Let patient know will follow up with her within the next couple weeks with results. Beth Mueller verbalized understanding.  Pascal Lux, NP 9:12 AM

## 2022-12-10 NOTE — Progress Notes (Signed)
[name] Beth Mueller is a 72 y.o. female who presents to [facility] clinic today with no complaints. Scholarship callback for right breast asymmetry.    Pap Smear: Pap not smear completed today due to hysterectomy.   Physical exam: Breasts Breasts symmetrical. No skin abnormalities bilateral breasts. No nipple retraction bilateral breasts. No nipple discharge bilateral breasts. No lymphadenopathy. No lumps palpated bilateral breasts.  MS 3D SCR UNI RT BR (aka MM)  Result Date: 10/26/2022 CLINICAL DATA:  Screening. History of LEFT mastectomy. EXAM: DIGITAL SCREENING UNILATERAL RIGHT MAMMOGRAM WITH CAD AND TOMOSYNTHESIS TECHNIQUE: Right screening digital craniocaudal and mediolateral oblique mammograms were obtained. Right screening digital breast tomosynthesis was performed. The images were evaluated with computer-aided detection. COMPARISON:  Previous exam(s). ACR Breast Density Category b: There are scattered areas of fibroglandular density. FINDINGS: In the right breast, a possible focal asymmetry warrants further evaluation. The patient has had a LEFT mastectomy. IMPRESSION: Further evaluation is suggested for possible focal asymmetry in the right breast. RECOMMENDATION: Diagnostic mammogram and possibly ultrasound of the right breast. (Code:FI-R-11M) The patient will be contacted regarding the findings, and additional imaging will be scheduled. BI-RADS CATEGORY  0: Incomplete: Need additional imaging evaluation. Electronically Signed   By: Harmon Pier M.D.   On: 10/26/2022 07:43   MS DIGITAL SCREENING TOMO UNI RIGHT  Result Date: 03/06/2020 CLINICAL DATA:  Screening. EXAM: DIGITAL SCREENING UNILATERAL RIGHT MAMMOGRAM WITH CAD AND TOMO COMPARISON:  Previous exam(s). ACR Breast Density Category b: There are scattered areas of fibroglandular density. FINDINGS: The patient has had a left mastectomy. There are no findings suspicious for malignancy. Images were processed with CAD. IMPRESSION: No  mammographic evidence of malignancy. A result letter of this screening mammogram will be mailed directly to the patient. RECOMMENDATION: Screening mammogram in one year.  (Code:SM-R-74M) BI-RADS CATEGORY  1: Negative. Electronically Signed   By: Gerome Sam III M.D   On: 03/06/2020 16:32         Pelvic/Bimanual Pap is not indicated today    Smoking History: Patient has never smoked and was not referred to quit line.    Patient Navigation: Patient education provided. Access to services provided for patient through [facility] program. Natale Lay interpreter provided. No transportation provided   Colorectal Cancer Screening: Per patient has never had colonoscopy completed No complaints today. FIT test given.    Breast and Cervical Cancer Risk Assessment: Patient does not have family history of breast cancer, known genetic mutations, or radiation treatment to the chest before age 40. Patient does not have history of cervical dysplasia, immunocompromised, or DES exposure in-utero.  Risk Assessment   No risk assessment data       A: [facility] exam without pap smear No complaints. Follow up screening mammogram for possible right breast asymmetry.   P: Referred patient to the [facility] for a diagnostic mammogram. Appointment scheduled 12/09/22.  Ilda Basset A, NP 12/10/2022 9:09 AM

## 2023-01-18 NOTE — Progress Notes (Signed)
 [facility] New Patient Visit  PCP: No Pcp   Portions of this note were dictated using DRAGON voice recognition software. Please disregard any errors in transcription. This record has been created using Conservation officer, historic buildings. Errors have been sought and corrected, but may not always be located. Such creation errors do not reflect on the standard of medical care.  Reason for Visit:  Chief Complaint  Patient presents with  . New Patient    Cardiology Assessment & Plan:  1. Chest pain, unspecified type - ECG 12 lead - Transthoracic echo (TTE) complete; Future - Stress test; Future  2. History of breast cancer  - Transthoracic echo (TTE) complete; Future    Return in about 3 months (around 04/20/2023).   The patient speaks Spanish.  The interpreter system-- [employer] was used.  Beth Mueller [phone] was used during the exam.   PLAN:    Ms. Jeani is experiencing some chest discomfort.  It seems somewhat atypical in nature being intermittent and sometimes with stress and sometimes with rest.  She does not have much in the way of risk factors from a cardiac standpoint.  I suspect she warrants some evaluation from a cardiac standpoint.  I would like her to have an echocardiogram to assess her left ventricular systolic function particularly with her history of breast cancer.  I would also like her to have an exercise treadmill stress test.  Although she has had knee replacement she thinks she can walk on the treadmill.  If she is not able to do so we will switch to a Lexiscan stress.  I will see her back after the above-mentioned tests.  History of Present Illness  Beth Mueller is a 72 y.o. female with a past medical history significant for breast cancer who presented to the office today at the request of Sarah Sw[place], MD for evaluation of chest discomfort.  The patient states for 6 months or so she has been experiencing  some chest discomfort.  She describes a midsternal discomfort.  It does not seem to radiate and is not associated with shortness of breath, nausea, vomiting or diaphoresis.  She mentioned to her primary care provider and was started on some medicines for reflux with improvement in symptoms.  She is not having nearly as much in the way of symptoms now.  Now she has episodes about once a week.  The episodes sometimes occur with rest and sometimes with exertion.  She walks a mile a day and seems to be able to do that without difficulty.  Her medical history is marked for a history of breast cancer.  She is a lifetime non-smoker.  Family history is negative premature onset CAD.  Not on File  Medications:   Current Outpatient Medications:  .  famotidine (PEPCID) 40 mg tablet, Take 1 tablet by mouth daily., Disp: , Rfl:  .  metoprolol  tartrate (LOPRESSOR ) 25 mg tablet, TAKE 1 TABLET BY MOUTH TWICE DAILY, Disp: , Rfl:  .  azithromycin  (ZITHROMAX ) 500 mg tablet, TAKE 1 TABLET BY MOUTH ONCE DAILY FOR 4 DAYS (Patient not taking: Reported on 01/18/2023), Disp: , Rfl:  .  HYDROcodone-chlorpheniramine (TUSSIONEX PENNKINETIC) 10-8 mg/5 mL ER suspension, TAKE 5 ML BY MOUTH EVERY 12 HOURS AS NEEDED FOR COUGH (REFRACTORY) (Patient not taking: Reported on 01/18/2023), Disp: , Rfl:  .  meloxicam (MOBIC) 15 mg tablet, TAKE 1 TABLET BY MOUTH ONCE DAILY AFTER A MEAL (Patient not taking: Reported on 01/18/2023),  Disp: 30 tablet, Rfl: 1 .  methocarbamoL (ROBAXIN) 750 mg tablet, TAKE 1 TABLET BY MOUTH EVERY 4 HOURS AS NEEDED FOR PAIN (Patient not taking: Reported on 01/18/2023), Disp: , Rfl:  .  predniSONE  (DELTASONE ) 10 mg tablet, TAKE 4 TABLETS BY MOUTH ONCE DAILY FOR 2 DAYS AND THEN 3 ONCE DAILY FOR 2 DAYS AND THEN 2 ONCE DAILY FOR 2 DAYS AND THEN 1 ONCE DAILY FOR 2 (Patient not taking: Reported on 01/18/2023), Disp: , Rfl:    Past Medical History:  Diagnosis Date  . Cancer (CMD) > 10 yrs ago   left    Past Surgical  History:  Procedure Laterality Date  . HERNIA REPAIR     Procedure: HERNIA REPAIR; Umbilical  . HYSTERECTOMY      Procedure: HYSTERECTOMY  . MASTECTOMY  > 10 yrs ago   Procedure: MASTECTOMY; left    Social History: Social History   Tobacco Use  Smoking Status Never  Smokeless Tobacco Never  Tobacco Comments   1 smoker in household   Social History   Substance and Sexual Activity  Alcohol Use No   Social History   Substance and Sexual Activity  Drug Use No    Family History  Problem Relation Name Age of Onset  . Breast cancer Sister    . Diabetes Sister    . Diabetes Father    . Cancer Father    . BRCA 1/2 Neg Hx    . Ovarian cancer Neg Hx    . Endometrial cancer Neg Hx      Review of Systems: A limited review of systems was performed.  Pertinent positives are noted in the HPI.  All others are negative   Objective  Physical Exam: Vitals:   01/18/23 1055  BP: (!) 178/91  Pulse:   SpO2:    @VSINC @ Body mass index is 31.64 kg/m.   General appearance - alert, well appearing, and in no distress, oriented to person, place, and time, and normal appearing weight Mental status - alert, oriented to person, place, and time, normal mood, behavior, speech, dress, motor activity, and thought processes Eyes - left eye normal, right eye normal Ears - right ear normal, left ear normal Nose - normal and patent, no erythema, discharge or polyps Mouth - mucous membranes moist, pharynx normal without lesions Neck - supple, no significant adenopathy Chest - clear to auscultation, no wheezes, rales or rhonchi, symmetric air entry Heart - normal rate, regular rhythm, normal S1, S2, no murmurs, rubs, clicks or gallops Abdomen - soft, nontender, nondistended, no masses or organomegaly Neurological - alert, oriented, normal speech, no focal findings or movement disorder noted Musculoskeletal - no joint tenderness, deformity or swelling Extremities - peripheral pulses normal,  no pedal edema, no clubbing or cyanosis Skin - normal coloration and turgor, no rashes, no suspicious skin lesions noted      Test Results electrocardiogram (ECG) sinus rhythm-within normal limits  Labs:  No results found for: NA, K, MG, BUN, CREATININE, CHOL, TRIG, HDL, LDL, HGB, HCT, PLT, TSH No results found for: INR, PROTIME    Debby Isla Manner, MD, MD, [facility] 01/18/2023, 11:10 AM

## 2023-11-11 ENCOUNTER — Ambulatory Visit (INDEPENDENT_AMBULATORY_CARE_PROVIDER_SITE_OTHER)
Admission: RE | Admit: 2023-11-11 | Discharge: 2023-11-11 | Disposition: A | Discharge status: Home / Self Care | Payer: Self-pay | Source: Ambulatory Visit | Attending: Family Medicine | Admitting: Family Medicine

## 2023-11-11 ENCOUNTER — Other Ambulatory Visit (HOSPITAL_BASED_OUTPATIENT_CLINIC_OR_DEPARTMENT_OTHER): Payer: Self-pay | Admitting: Family Medicine

## 2023-11-11 DIAGNOSIS — Z780 Asymptomatic menopausal state: Secondary | ICD-10-CM

## 2023-11-19 ENCOUNTER — Other Ambulatory Visit (HOSPITAL_BASED_OUTPATIENT_CLINIC_OR_DEPARTMENT_OTHER): Payer: Self-pay | Admitting: Family Medicine

## 2023-11-19 DIAGNOSIS — R7989 Other specified abnormal findings of blood chemistry: Secondary | ICD-10-CM

## 2023-11-19 DIAGNOSIS — R1084 Generalized abdominal pain: Secondary | ICD-10-CM

## 2023-11-29 LAB — COLOGUARD: COLOGUARD: NEGATIVE

## 2023-11-30 ENCOUNTER — Ambulatory Visit (INDEPENDENT_AMBULATORY_CARE_PROVIDER_SITE_OTHER)
Admission: RE | Admit: 2023-11-30 | Discharge: 2023-11-30 | Disposition: A | Discharge status: Home / Self Care | Payer: Self-pay | Source: Ambulatory Visit | Attending: Family Medicine | Admitting: Family Medicine

## 2023-11-30 DIAGNOSIS — R7989 Other specified abnormal findings of blood chemistry: Secondary | ICD-10-CM

## 2023-11-30 DIAGNOSIS — R1084 Generalized abdominal pain: Secondary | ICD-10-CM

## 2023-12-06 ENCOUNTER — Ambulatory Visit (HOSPITAL_BASED_OUTPATIENT_CLINIC_OR_DEPARTMENT_OTHER): Payer: Self-pay | Admitting: Family Medicine

## 2024-01-14 DIAGNOSIS — R079 Chest pain, unspecified: Secondary | ICD-10-CM

## 2024-01-14 DIAGNOSIS — I361 Nonrheumatic tricuspid (valve) insufficiency: Secondary | ICD-10-CM

## 2024-01-15 DIAGNOSIS — R079 Chest pain, unspecified: Secondary | ICD-10-CM
